# Patient Record
Sex: Male | Born: 1960 | Hispanic: No | Marital: Married | Smoking: Never smoker
Health system: Southern US, Community
[De-identification: ages and names within clinical notes are randomized; demographics above are authoritative.]

## PROBLEM LIST (undated history)

## (undated) DIAGNOSIS — E785 Hyperlipidemia, unspecified: Secondary | ICD-10-CM

## (undated) DIAGNOSIS — Z87442 Personal history of urinary calculi: Secondary | ICD-10-CM

## (undated) HISTORY — DX: Hyperlipidemia, unspecified: E78.5

## (undated) HISTORY — PX: SHOULDER ARTHROSCOPY: SHX128

---

## 2007-01-12 ENCOUNTER — Ambulatory Visit (HOSPITAL_BASED_OUTPATIENT_CLINIC_OR_DEPARTMENT_OTHER): Admission: RE | Admit: 2007-01-12 | Discharge: 2007-01-12 | Payer: Self-pay | Admitting: Orthopedic Surgery

## 2008-02-06 ENCOUNTER — Encounter: Admission: RE | Admit: 2008-02-06 | Discharge: 2008-02-06 | Payer: Self-pay | Admitting: Family Medicine

## 2010-06-15 NOTE — Op Note (Signed)
NAME:  Fernando [name], Fernando [name]                  ACCOUNT NO.:  [id number]   MEDICAL RECORD NO.:  [id number]          PATIENT TYPE:  AMB   LOCATION:  DSC                          FACILITY:  [facility]   PHYSICIAN:  Fernando [name], M.D.   DATE OF BIRTH:  September 07, 1960   DATE OF PROCEDURE:  01/12/2007  DATE OF DISCHARGE:                               OPERATIVE REPORT   PREOPERATIVE DIAGNOSES:  1. Rotator cuff tear, left.  2. Impingement.  3. Acromioclavicular joint arthritis.   POSTOPERATIVE DIAGNOSES:  1. Rotator cuff tear, left.  2. Impingement.  3. Acromioclavicular joint arthritis.   PROCEDURE:  1. Mini open rotator cuff repair of acutely torn rotator cuff.  2. Arthroscopic acromioplasty from lateral and posterior compartment.  3. Arthroscopic distal clavicle resection from an anterior      compartment.   SURGEON:  Fernando [name], M.D.   ASSISTANT:  Marshia Ly, P.A.   ANESTHESIA:  General.   BRIEF HISTORY:  [name] is a 50 year old male with a long history of  having had significant left shoulder problems.  He ultimately was  evaluated in the office and felt to have impingement AC joint arthritis.  An MRI was obtained because of some concerns of a look on the humeral  head, and this showed that he did have a full thickness rotator cuff  tear.  We treated him conservatively for a period of time.  Because of  the failure of all conservative care, he was ultimately taken to the  operating room for arthroscopic subacromial decompression and distal  clavicle resection and fixation of rotator cuff.   PROCEDURE:  Patient was taken to the operating room.  After adequate  anesthesia was obtained with general anesthetic, the patient was placed  on the operating room table.  The left shoulder was then prepped and  draped in the usual sterile fashion.  Following this, routine  arthroscopic examination of the shoulder revealed there was an obvious  rotator cuff tear on the under-surface just  posterior to the biceps  tendon insertion and supraspinatus.  Following this, attention was  turned to the glenohumeral joint.  No evidence of arthritis.  The biceps  tendon was normal.  Attention to the posterior superior labrum was  normal.  Attention was turned out of the glenohumeral joint into the  subacromial space.  At this point, an anterolateral acromioplasty was  performed from the lateral and posterior compartment.  The attention was  then turned towards the distal clavicle, which was resected over 18 mm  from an anterior compartment.  The rotator cuff was then identified from  the top side, having a small full-thickness tear.  Knowing what we knew  from the inside, we knew that it was going to be important to open this  up and extend this small tear so that we could get the full portion of  the cuff that was torn anchored back down to bone.  At this point, the  arthroscopic portion of the case was abandoned.  A small incision was  made laterally to the subcutaneous tissues __________  the deltoid,  divided in line with its fibers.  Attention was then turned to the  underlying rotator cuff.  The underlying rotator cuff was debrided on  the under-surface back to a full viable tendon.  The rotator cuff was  then repaired with a single 5.5 anchor with anterior and posterior  knots.  Excellent repair of the rotator cuff was achieved at this point.  The arm was put through an easy, full range of motion.  At this point,  the shoulder was copiously and thoroughly irrigated and suctioned dry.  The arthroscopic portals were closed with a bandage.  The lateral wound  was closed with one Vicryl running in the deltoid and 0 Vicryl in the  subcu and then 3-0 Monocryl subcuticular stitch.  Benzoin and Steri-  Strips were applied.  A sterile compressive dressing was applied over  the entire shoulder, and the patient was then taken to the recovery room  and was noted to be in satisfactory  condition.  Estimated blood loss for  the procedure was __________ .      Fernando [name], M.D.  Electronically Signed     JLG/MEDQ  D:  01/12/2007  T:  01/12/2007  Job:  [id number]

## 2010-11-08 LAB — POCT HEMOGLOBIN-HEMACUE: Hemoglobin: 16.6

## 2017-01-20 ENCOUNTER — Telehealth: Payer: Self-pay

## 2017-01-20 NOTE — Telephone Encounter (Signed)
.........  CHART PREP RJ...... 

## 2017-03-01 NOTE — Progress Notes (Deleted)
    Cardiology Office Note   Date:  03/01/2017   ID:  Fernando [name], DOB 11/27/1960, MRN [id number]  PCP:  No primary care provider on file.  Cardiologist:   No primary care provider on file. Referring:  ***  No chief complaint on file.     History of Present Illness: Fernando [name] is a 57 y.o. male who is referred by *** for evaluation of chest pain.       No past medical history on file.  *** The histories are not reviewed yet. Please review them in the "History" navigator section and refresh this SmartLink.   No current outpatient medications on file.   No current facility-administered medications for this visit.     Allergies:   Patient has no allergy information on record.    Social History:  The patient     Family History:  The patient's ***family history is not on file.    ROS:  Please see the history of present illness.   Otherwise, review of systems are positive for {NONE DEFAULTED:18576::"none"}.   All other systems are reviewed and negative.    PHYSICAL EXAM: VS:  There were no vitals taken for this visit. , BMI There is no height or weight on file to calculate BMI. GENERAL:  Well appearing HEENT:  Pupils equal round and reactive, fundi not visualized, oral mucosa unremarkable NECK:  No jugular venous distention, waveform within normal limits, carotid upstroke brisk and symmetric, no bruits, no thyromegaly LYMPHATICS:  No cervical, inguinal adenopathy LUNGS:  Clear to auscultation bilaterally BACK:  No CVA tenderness CHEST:  Unremarkable HEART:  PMI not displaced or sustained,S1 and S2 within normal limits, no S3, no S4, no clicks, no rubs, *** murmurs ABD:  Flat, positive bowel sounds normal in frequency in pitch, no bruits, no rebound, no guarding, no midline pulsatile mass, no hepatomegaly, no splenomegaly EXT:  2 plus pulses throughout, no edema, no cyanosis no clubbing SKIN:  No rashes no nodules NEURO:  Cranial nerves II through XII  grossly intact, motor grossly intact throughout PSYCH:  Cognitively intact, oriented to person place and time    EKG:  EKG {ACTION; IS/IS XLK:[id number]}OT:[id number]} ordered today. The ekg ordered today demonstrates ***   Recent Labs: No results found for requested labs within last 8760 hours.    Lipid Panel No results found for: CHOL, TRIG, HDL, CHOLHDL, VLDL, LDLCALC, LDLDIRECT    Wt Readings from Last 3 Encounters:  No data found for Wt      Other studies Reviewed: Additional studies/ records that were reviewed today include: ***. Review of the above records demonstrates:  Please see elsewhere in the note.  ***   ASSESSMENT AND PLAN:  CHEST PAIN:  ***    Current medicines are reviewed at length with the patient today.  The patient {ACTIONS; HAS/DOES NOT HAVE:19233} concerns regarding medicines.  The following changes have been made:  {PLAN; NO CHANGE:13088:s}  Labs/ tests ordered today include: *** No orders of the defined types were placed in this encounter.    Disposition:   FU with ***    Signed, Rollene RotundaJames Nixon Kolton, MD  03/01/2017 9:00 PM    [facility]

## 2017-03-03 ENCOUNTER — Ambulatory Visit: Payer: Self-pay | Admitting: Cardiology

## 2017-06-03 ENCOUNTER — Encounter (HOSPITAL_COMMUNITY): Payer: Self-pay

## 2017-06-03 ENCOUNTER — Other Ambulatory Visit: Payer: Self-pay

## 2017-06-03 ENCOUNTER — Emergency Department (HOSPITAL_COMMUNITY)
Admission: EM | Admit: 2017-06-03 | Discharge: 2017-06-04 | Disposition: A | Discharge status: Home / Self Care | Payer: No Typology Code available for payment source | Attending: Emergency Medicine | Admitting: Emergency Medicine

## 2017-06-03 DIAGNOSIS — R1032 Left lower quadrant pain: Secondary | ICD-10-CM | POA: Diagnosis present

## 2017-06-03 DIAGNOSIS — N201 Calculus of ureter: Secondary | ICD-10-CM | POA: Diagnosis not present

## 2017-06-03 NOTE — ED Triage Notes (Signed)
Pt came in ems, severe abd pain started after dinner. Per pt states gluten allergy. States had bread at dinner. Rates pain 10/10

## 2017-06-03 NOTE — ED Triage Notes (Signed)
[facility] reports with LLQ abd pain with N/V, denies dx of celiac but pt ate a lot of bread.  Recent reduced  Bilateral hernias, pt guarding LLQ. [facility] reports pt requesting something to help him "relax".  Pt received 4 mg Zofran IV en route.

## 2017-06-04 ENCOUNTER — Emergency Department (HOSPITAL_COMMUNITY): Payer: No Typology Code available for payment source

## 2017-06-04 LAB — COMPREHENSIVE METABOLIC PANEL
ALT: 20 U/L (ref 17–63)
ANION GAP: 8 (ref 5–15)
AST: 20 U/L (ref 15–41)
Albumin: 4 g/dL (ref 3.5–5.0)
Alkaline Phosphatase: 49 U/L (ref 38–126)
BUN: 22 mg/dL — ABNORMAL HIGH (ref 6–20)
CO2: 26 mmol/L (ref 22–32)
CREATININE: 1.21 mg/dL (ref 0.61–1.24)
Calcium: 9 mg/dL (ref 8.9–10.3)
Chloride: 109 mmol/L (ref 101–111)
Glucose, Bld: 150 mg/dL — ABNORMAL HIGH (ref 65–99)
Potassium: 3.8 mmol/L (ref 3.5–5.1)
SODIUM: 143 mmol/L (ref 135–145)
Total Bilirubin: 0.8 mg/dL (ref 0.3–1.2)
Total Protein: 6.2 g/dL — ABNORMAL LOW (ref 6.5–8.1)

## 2017-06-04 LAB — CBC
HCT: 40.1 % (ref 39.0–52.0)
Hemoglobin: 13.3 g/dL (ref 13.0–17.0)
MCH: 30 pg (ref 26.0–34.0)
MCHC: 33.2 g/dL (ref 30.0–36.0)
MCV: 90.5 fL (ref 78.0–100.0)
PLATELETS: 234 10*3/uL (ref 150–400)
RBC: 4.43 MIL/uL (ref 4.22–5.81)
RDW: 12.7 % (ref 11.5–15.5)
WBC: 12.2 10*3/uL — AB (ref 4.0–10.5)

## 2017-06-04 LAB — URINALYSIS, ROUTINE W REFLEX MICROSCOPIC
Bilirubin Urine: NEGATIVE
GLUCOSE, UA: NEGATIVE mg/dL
Ketones, ur: 5 mg/dL — AB
Leukocytes, UA: NEGATIVE
Nitrite: NEGATIVE
PROTEIN: NEGATIVE mg/dL
Specific Gravity, Urine: 1.013 (ref 1.005–1.030)
pH: 7 (ref 5.0–8.0)

## 2017-06-04 LAB — LIPASE, BLOOD: LIPASE: 23 U/L (ref 11–51)

## 2017-06-04 MED ORDER — HYDROCODONE-ACETAMINOPHEN 5-325 MG PO TABS: 1.0000 | ORAL_TABLET | Freq: Four times a day (QID) | ORAL | 0 refills | Status: DC | PRN

## 2017-06-04 MED ORDER — HYDROCODONE-ACETAMINOPHEN 5-325 MG PO TABS
2.0000 | ORAL_TABLET | Freq: Once | ORAL | Status: AC
  Administered 2017-06-04: 2 via ORAL
  Filled 2017-06-04: qty 2

## 2017-06-04 MED ORDER — ONDANSETRON HCL 4 MG/2ML IJ SOLN
4.0000 mg | Freq: Once | INTRAMUSCULAR | Status: AC
  Administered 2017-06-04: 4 mg via INTRAVENOUS
  Filled 2017-06-04: qty 2

## 2017-06-04 MED ORDER — TAMSULOSIN HCL 0.4 MG PO CAPS: 0.4000 mg | ORAL_CAPSULE | Freq: Every day | ORAL | 0 refills | Status: DC

## 2017-06-04 MED ORDER — FENTANYL CITRATE (PF) 100 MCG/2ML IJ SOLN
100.0000 ug | Freq: Once | INTRAMUSCULAR | Status: AC
  Administered 2017-06-04: 100 ug via INTRAVENOUS
  Filled 2017-06-04: qty 2

## 2017-06-04 MED ORDER — CEPHALEXIN 500 MG PO CAPS: 500.0000 mg | ORAL_CAPSULE | Freq: Two times a day (BID) | ORAL | 0 refills | Status: DC

## 2017-06-04 MED ORDER — SODIUM CHLORIDE 0.9 % IV BOLUS
1000.0000 mL | Freq: Once | INTRAVENOUS | Status: AC
  Administered 2017-06-04: 1000 mL via INTRAVENOUS

## 2017-06-04 MED ORDER — CEPHALEXIN 500 MG PO CAPS
500.0000 mg | ORAL_CAPSULE | Freq: Once | ORAL | Status: AC
  Administered 2017-06-04: 500 mg via ORAL
  Filled 2017-06-04: qty 1

## 2017-06-04 MED ORDER — IOPAMIDOL (ISOVUE-300) INJECTION 61%
100.0000 mL | Freq: Once | INTRAVENOUS | Status: AC | PRN
  Administered 2017-06-04: 100 mL via INTRAVENOUS

## 2017-06-04 NOTE — ED Provider Notes (Signed)
[facility] EMERGENCY DEPARTMENT Provider Note   CSN: [id number] Arrival date & time: 06/03/17  2210     History   Chief Complaint Chief Complaint  Patient presents with  . Abdominal Pain    HPI Fernando [name] is a 57 y.o. male.  The history is provided by the patient and the spouse.  Abdominal Pain   This is a new problem. The current episode started 3 to 5 hours ago. The problem occurs constantly. The problem has been rapidly worsening. The pain is associated with eating. The pain is located in the LLQ. The pain is severe. Associated symptoms include nausea and vomiting. Pertinent negatives include fever and dysuria. The symptoms are aggravated by certain positions and palpation. Nothing relieves the symptoms.  Patient reports after eating bread tonight he had sudden onset of left lower quadrant abdominal pain.  He reports nausea and vomiting, with nonbloody emesis.  No fever.  No chest pain shortness of breath.  No diarrhea or constipation.  No change in his bowel habits. He was recently seen in urgent care in [facility] and was told he had a hernia. He reports recently when he eats bread he has abdominal pain he thinks he has a gluten allergy   PMH-none surg hx - none Home Medications    Prior to Admission medications   Not on File    Family History No family history on file.  Social History Social History   Tobacco Use  . Smoking status: Not on file  Substance Use Topics  . Alcohol use: Never    Frequency: Never  . Drug use: Never     Allergies   Gluten meal   Review of Systems Review of Systems  Constitutional: Negative for fever.  Respiratory: Negative for shortness of breath.   Cardiovascular: Negative for chest pain.  Gastrointestinal: Positive for abdominal pain, nausea and vomiting.  Genitourinary: Negative for dysuria.  All other systems reviewed and are negative.    Physical Exam Updated Vital Signs BP 136/78 (BP  Location: Right Arm)   Pulse 88   Temp 98.2 F (36.8 C) (Oral)   Resp 20   Ht 1.778 m ( )   Wt 81.6 kg (180 lb)   SpO2 98%   BMI 25.83 kg/m   Physical Exam CONSTITUTIONAL: Well developed/well nourished HEAD: Normocephalic/atraumatic EYES: EOMI/PERRL ENMT: Mucous membranes dry NECK: supple no meningeal signs SPINE/BACK:entire spine nontender CV: S1/S2 noted, no murmurs/rubs/gallops noted LUNGS: Lungs are clear to auscultation bilaterally, no apparent distress ABDOMEN: soft, moderate left lower quadrant tenderness, no rebound or guarding, bowel sounds noted throughout abdomen No abdominal wall hernia noted GU:no cva tenderness, no inguinal hernia.  No testicular tenderness.  Testicles descended bilaterally Wife at bedside per patient request NEURO: Pt is awake/alert/appropriate, moves all extremitiesx4.  No facial droop.   EXTREMITIES: pulses normal/equal, full ROM SKIN: warm, color normal PSYCH: no abnormalities of mood noted, alert and oriented to situation   ED Treatments / Results  Labs (all labs ordered are listed, but only abnormal results are displayed) Labs Reviewed  COMPREHENSIVE METABOLIC PANEL - Abnormal; Notable for the following components:      Result Value   Glucose, Bld 150 (*)    BUN 22 (*)    Total Protein 6.2 (*)    All other components within normal limits  CBC - Abnormal; Notable for the following components:   WBC 12.2 (*)    All other components within normal limits  URINALYSIS, ROUTINE W  REFLEX MICROSCOPIC - Abnormal; Notable for the following components:   Hgb urine dipstick LARGE (*)    Ketones, ur 5 (*)    RBC / HPF >50 (*)    Bacteria, UA RARE (*)    All other components within normal limits  LIPASE, BLOOD    EKG None  Radiology No results found.  Procedures Procedures (including critical care time)  Medications Ordered in ED Medications  HYDROcodone-acetaminophen (NORCO/VICODIN) 5-325 MG per tablet 2 tablet (has no  administration in time range)  cephALEXin (KEFLEX) capsule 500 mg (has no administration in time range)  fentaNYL (SUBLIMAZE) injection 100 mcg (100 mcg Intravenous Given 06/04/17 0045)  ondansetron (ZOFRAN) injection 4 mg (4 mg Intravenous Given 06/04/17 0044)  sodium chloride 0.9 % bolus 1,000 mL (0 mLs Intravenous Stopped 06/04/17 0126)  iopamidol (ISOVUE-300) 61 % injection 100 mL (100 mLs Intravenous Contrast Given 06/04/17 0053)     Initial Impression / Assessment and Plan / ED Course  I have reviewed the triage vital signs and the nursing notes. Narcotic database reviewed and considered in decision making Pertinent labs  results that were available during my care of the patient were reviewed by me and considered in my medical decision making (see chart for details).     Improved.  He has a very large ureteral stone. No Signs of any other acute abdominal emergency.  No signs of any incarcerated hernias. He is improved.  He is not septic appearing.  He is afebrile. Prescribe pain medicines, Flomax, Keflex.  He should call urology in 24 hours.  We discussed strict ER return precautions  Final Clinical Impressions(s) / ED Diagnoses   Final diagnoses:  Ureteral stone    ED Discharge Orders        Ordered    HYDROcodone-acetaminophen (NORCO/VICODIN) 5-325 MG tablet  Every 6 hours PRN     06/04/17 0240    cephALEXin (KEFLEX) 500 MG capsule  2 times daily     06/04/17 0240    tamsulosin (FLOMAX) 0.4 MG CAPS capsule  Daily after supper     06/04/17 0240       Zadie Rhine, MD 06/04/17 [phone]

## 2017-06-05 ENCOUNTER — Other Ambulatory Visit: Payer: Self-pay

## 2017-06-05 ENCOUNTER — Other Ambulatory Visit: Payer: Self-pay | Admitting: Urology

## 2017-06-05 ENCOUNTER — Ambulatory Visit (HOSPITAL_COMMUNITY): Payer: No Typology Code available for payment source | Admitting: Certified Registered"

## 2017-06-05 ENCOUNTER — Encounter (HOSPITAL_COMMUNITY): Payer: Self-pay | Admitting: *Deleted

## 2017-06-05 ENCOUNTER — Emergency Department (HOSPITAL_COMMUNITY)
Admission: EM | Admit: 2017-06-05 | Discharge: 2017-06-05 | Disposition: A | Discharge status: Home / Self Care | Payer: No Typology Code available for payment source | Source: Home / Self Care | Attending: Emergency Medicine | Admitting: Emergency Medicine

## 2017-06-05 ENCOUNTER — Ambulatory Visit (HOSPITAL_COMMUNITY): Payer: No Typology Code available for payment source

## 2017-06-05 ENCOUNTER — Ambulatory Visit (HOSPITAL_COMMUNITY)
Admission: RE | Admit: 2017-06-05 | Discharge: 2017-06-05 | Disposition: A | Discharge status: Home / Self Care | Payer: No Typology Code available for payment source | Source: Other Acute Inpatient Hospital | Attending: Urology | Admitting: Urology

## 2017-06-05 ENCOUNTER — Emergency Department (HOSPITAL_COMMUNITY)
Admission: EM | Admit: 2017-06-05 | Discharge: 2017-06-05 | Discharge status: Absent without leave | Payer: PRIVATE HEALTH INSURANCE | Source: Home / Self Care

## 2017-06-05 ENCOUNTER — Encounter (HOSPITAL_COMMUNITY)
Admission: RE | Disposition: A | Discharge status: Home / Self Care | Payer: Self-pay | Source: Other Acute Inpatient Hospital | Attending: Urology

## 2017-06-05 DIAGNOSIS — N201 Calculus of ureter: Secondary | ICD-10-CM | POA: Insufficient documentation

## 2017-06-05 DIAGNOSIS — N23 Unspecified renal colic: Secondary | ICD-10-CM | POA: Insufficient documentation

## 2017-06-05 DIAGNOSIS — Z79899 Other long term (current) drug therapy: Secondary | ICD-10-CM

## 2017-06-05 HISTORY — PX: CYSTOSCOPY W/ URETERAL STENT PLACEMENT: SHX1429

## 2017-06-05 LAB — CBC WITH DIFFERENTIAL/PLATELET
BASOS PCT: 0 %
Basophils Absolute: 0 10*3/uL (ref 0.0–0.1)
EOS PCT: 2 %
Eosinophils Absolute: 0.2 10*3/uL (ref 0.0–0.7)
HCT: 43.3 % (ref 39.0–52.0)
HEMOGLOBIN: 14.3 g/dL (ref 13.0–17.0)
Lymphocytes Relative: 21 %
Lymphs Abs: 2.1 10*3/uL (ref 0.7–4.0)
MCH: 30.2 pg (ref 26.0–34.0)
MCHC: 33 g/dL (ref 30.0–36.0)
MCV: 91.4 fL (ref 78.0–100.0)
Monocytes Absolute: 0.7 10*3/uL (ref 0.1–1.0)
Monocytes Relative: 7 %
NEUTROS PCT: 70 %
Neutro Abs: 7.2 10*3/uL (ref 1.7–7.7)
PLATELETS: 215 10*3/uL (ref 150–400)
RBC: 4.74 MIL/uL (ref 4.22–5.81)
RDW: 12.9 % (ref 11.5–15.5)
WBC: 10.2 10*3/uL (ref 4.0–10.5)

## 2017-06-05 LAB — BASIC METABOLIC PANEL
Anion gap: 9 (ref 5–15)
BUN: 17 mg/dL (ref 6–20)
CHLORIDE: 104 mmol/L (ref 101–111)
CO2: 26 mmol/L (ref 22–32)
CREATININE: 1.05 mg/dL (ref 0.61–1.24)
Calcium: 8.7 mg/dL — ABNORMAL LOW (ref 8.9–10.3)
Glucose, Bld: 132 mg/dL — ABNORMAL HIGH (ref 65–99)
POTASSIUM: 3.5 mmol/L (ref 3.5–5.1)
SODIUM: 139 mmol/L (ref 135–145)

## 2017-06-05 SURGERY — CYSTOSCOPY, WITH RETROGRADE PYELOGRAM AND URETERAL STENT INSERTION
Anesthesia: General | Site: Ureter | Laterality: Left

## 2017-06-05 MED ORDER — PROPOFOL 10 MG/ML IV BOLUS
INTRAVENOUS | Status: DC | PRN
  Administered 2017-06-05: 200 mg via INTRAVENOUS

## 2017-06-05 MED ORDER — ONDANSETRON HCL 4 MG PO TABS: 4.0000 mg | ORAL_TABLET | Freq: Every day | ORAL | 1 refills | Status: DC | PRN

## 2017-06-05 MED ORDER — ONDANSETRON HCL 4 MG/2ML IJ SOLN
4.0000 mg | Freq: Once | INTRAMUSCULAR | Status: AC
  Administered 2017-06-05: 4 mg via INTRAVENOUS
  Filled 2017-06-05: qty 2

## 2017-06-05 MED ORDER — PHENYLEPHRINE HCL 10 MG/ML IJ SOLN
INTRAMUSCULAR | Status: DC | PRN
  Administered 2017-06-05: 80 ug via INTRAVENOUS

## 2017-06-05 MED ORDER — HYDROMORPHONE HCL 1 MG/ML IJ SOLN
1.0000 mg | Freq: Once | INTRAMUSCULAR | Status: AC
  Administered 2017-06-05: 1 mg via INTRAVENOUS
  Filled 2017-06-05: qty 1

## 2017-06-05 MED ORDER — LACTATED RINGERS IV SOLN
INTRAVENOUS | Status: DC
  Administered 2017-06-05: 16:00:00 via INTRAVENOUS

## 2017-06-05 MED ORDER — IOHEXOL 300 MG/ML  SOLN
INTRAMUSCULAR | Status: DC | PRN
  Administered 2017-06-05: 10 mL

## 2017-06-05 MED ORDER — CEFAZOLIN SODIUM-DEXTROSE 2-4 GM/100ML-% IV SOLN
INTRAVENOUS | Status: AC
  Filled 2017-06-05: qty 100

## 2017-06-05 MED ORDER — OXYCODONE HCL 5 MG PO TABS: 5.0000 mg | ORAL_TABLET | Freq: Once | ORAL | Status: DC | PRN

## 2017-06-05 MED ORDER — ONDANSETRON HCL 4 MG/2ML IJ SOLN
INTRAMUSCULAR | Status: AC
  Administered 2017-06-05: 4 mg via INTRAVENOUS
  Filled 2017-06-05: qty 2

## 2017-06-05 MED ORDER — DEXAMETHASONE SODIUM PHOSPHATE 10 MG/ML IJ SOLN
INTRAMUSCULAR | Status: DC | PRN
  Administered 2017-06-05: 10 mg via INTRAVENOUS

## 2017-06-05 MED ORDER — ONDANSETRON HCL 4 MG/2ML IJ SOLN
INTRAMUSCULAR | Status: AC
  Filled 2017-06-05: qty 2

## 2017-06-05 MED ORDER — SODIUM CHLORIDE 0.9 % IR SOLN
Status: DC | PRN
  Administered 2017-06-05: 1000 mL

## 2017-06-05 MED ORDER — 0.9 % SODIUM CHLORIDE (POUR BTL) OPTIME
TOPICAL | Status: DC | PRN
  Administered 2017-06-05: 1000 mL

## 2017-06-05 MED ORDER — FENTANYL CITRATE (PF) 100 MCG/2ML IJ SOLN: 25.0000 ug | INTRAMUSCULAR | Status: DC | PRN

## 2017-06-05 MED ORDER — ACETAMINOPHEN 325 MG PO TABS: 325.0000 mg | ORAL_TABLET | ORAL | Status: DC | PRN

## 2017-06-05 MED ORDER — FENTANYL CITRATE (PF) 100 MCG/2ML IJ SOLN
INTRAMUSCULAR | Status: AC
  Filled 2017-06-05: qty 2

## 2017-06-05 MED ORDER — EPHEDRINE SULFATE-NACL 50-0.9 MG/10ML-% IV SOSY
PREFILLED_SYRINGE | INTRAVENOUS | Status: DC | PRN
  Administered 2017-06-05: 5 mg via INTRAVENOUS
  Administered 2017-06-05: 10 mg via INTRAVENOUS
  Administered 2017-06-05: 5 mg via INTRAVENOUS

## 2017-06-05 MED ORDER — FENTANYL CITRATE (PF) 100 MCG/2ML IJ SOLN
INTRAMUSCULAR | Status: AC
  Administered 2017-06-05: 100 ug via INTRAVENOUS
  Filled 2017-06-05: qty 2

## 2017-06-05 MED ORDER — FENTANYL CITRATE (PF) 100 MCG/2ML IJ SOLN
INTRAMUSCULAR | Status: DC | PRN
  Administered 2017-06-05: 50 ug via INTRAVENOUS

## 2017-06-05 MED ORDER — DEXAMETHASONE SODIUM PHOSPHATE 10 MG/ML IJ SOLN
INTRAMUSCULAR | Status: AC
  Filled 2017-06-05: qty 1

## 2017-06-05 MED ORDER — ONDANSETRON HCL 4 MG/2ML IJ SOLN
4.0000 mg | Freq: Once | INTRAMUSCULAR | Status: AC
  Administered 2017-06-05: 4 mg via INTRAVENOUS

## 2017-06-05 MED ORDER — OXYCODONE-ACETAMINOPHEN 5-325 MG PO TABS
1.0000 | ORAL_TABLET | Freq: Once | ORAL | Status: AC
  Administered 2017-06-05: 1 via ORAL
  Filled 2017-06-05: qty 1

## 2017-06-05 MED ORDER — LIDOCAINE 2% (20 MG/ML) 5 ML SYRINGE
INTRAMUSCULAR | Status: DC | PRN
  Administered 2017-06-05: 100 mg via INTRAVENOUS

## 2017-06-05 MED ORDER — KETOROLAC TROMETHAMINE 10 MG PO TABS: 10.0000 mg | ORAL_TABLET | Freq: Four times a day (QID) | ORAL | 0 refills | Status: DC | PRN

## 2017-06-05 MED ORDER — ACETAMINOPHEN 160 MG/5ML PO SOLN: 325.0000 mg | ORAL | Status: DC | PRN

## 2017-06-05 MED ORDER — OXYCODONE HCL 5 MG/5ML PO SOLN
5.0000 mg | Freq: Once | ORAL | Status: DC | PRN
  Filled 2017-06-05: qty 5

## 2017-06-05 MED ORDER — LIDOCAINE 2% (20 MG/ML) 5 ML SYRINGE
INTRAMUSCULAR | Status: AC
  Filled 2017-06-05: qty 5

## 2017-06-05 MED ORDER — FENTANYL CITRATE (PF) 100 MCG/2ML IJ SOLN
50.0000 ug | INTRAMUSCULAR | Status: DC
  Administered 2017-06-05: 100 ug via INTRAVENOUS

## 2017-06-05 MED ORDER — ONDANSETRON HCL 4 MG/2ML IJ SOLN: 4.0000 mg | Freq: Once | INTRAMUSCULAR | Status: DC | PRN

## 2017-06-05 MED ORDER — OXYCODONE-ACETAMINOPHEN 5-325 MG PO TABS: 1.0000 | ORAL_TABLET | ORAL | 0 refills | Status: DC | PRN

## 2017-06-05 MED ORDER — PHENAZOPYRIDINE HCL 200 MG PO TABS: 200.0000 mg | ORAL_TABLET | Freq: Three times a day (TID) | ORAL | 0 refills | Status: DC | PRN

## 2017-06-05 MED ORDER — CEFAZOLIN SODIUM-DEXTROSE 2-4 GM/100ML-% IV SOLN
2.0000 g | Freq: Once | INTRAVENOUS | Status: AC
  Administered 2017-06-05: 2 g via INTRAVENOUS

## 2017-06-05 MED ORDER — ONDANSETRON HCL 4 MG/2ML IJ SOLN
INTRAMUSCULAR | Status: DC | PRN
  Administered 2017-06-05: 4 mg via INTRAVENOUS

## 2017-06-05 MED ORDER — MEPERIDINE HCL 50 MG/ML IJ SOLN: 6.2500 mg | INTRAMUSCULAR | Status: DC | PRN

## 2017-06-05 MED ORDER — MIDAZOLAM HCL 2 MG/2ML IJ SOLN
INTRAMUSCULAR | Status: AC
  Filled 2017-06-05: qty 2

## 2017-06-05 MED ORDER — MIDAZOLAM HCL 5 MG/5ML IJ SOLN
INTRAMUSCULAR | Status: DC | PRN
  Administered 2017-06-05: 2 mg via INTRAVENOUS

## 2017-06-05 SURGICAL SUPPLY — 13 items
BAG URO CATCHER STRL LF (MISCELLANEOUS) ×3 IMPLANT
CATH URET 5FR 28IN OPEN ENDED (CATHETERS) ×3 IMPLANT
CLOTH BEACON ORANGE TIMEOUT ST (SAFETY) ×3 IMPLANT
COVER FOOTSWITCH UNIV (MISCELLANEOUS) IMPLANT
COVER SURGICAL LIGHT HANDLE (MISCELLANEOUS) ×3 IMPLANT
GLOVE BIOGEL M STRL SZ7.5 (GLOVE) ×3 IMPLANT
GOWN STRL REUS W/TWL LRG LVL3 (GOWN DISPOSABLE) ×6 IMPLANT
GUIDEWIRE STR DUAL SENSOR (WIRE) ×3 IMPLANT
MANIFOLD NEPTUNE II (INSTRUMENTS) ×3 IMPLANT
PACK CYSTO (CUSTOM PROCEDURE TRAY) ×3 IMPLANT
STENT URET 6FRX26 CONTOUR (STENTS) ×3 IMPLANT
TUBING CONNECTING 10 (TUBING) ×2 IMPLANT
TUBING CONNECTING 10' (TUBING) ×1

## 2017-06-05 NOTE — Op Note (Signed)
Operative Note  Preoperative diagnosis:  1.  9 mm left UPJ stone  Postoperative diagnosis: 1.  9 mm left UPJ stone  Procedure(s): 1.  Cystoscopy with left JJ stent placement 2.  Left retrograde pyelogram with intraoperative interpretation of fluoroscopic imaging  Surgeon: Rhoderick Moody, MD  Assistants: None  Anesthesia: General LMA  Complications: None  EBL: Less than 5 mL  Specimens: 1.  None  Drains/Catheters: 1.  Left 6 French by 26 cm JJ stent without tether  Intraoperative findings:   1. 9 millimeter left proximal ureteral stone seen on fluoroscopy 2.  Solitary left collecting system with a filling defect seen in the proximal aspects of the left ureter consistent with a 9 mm stone seen on his recent CT scan.  There were no other filling defects or abnormality seen within the renal pelvis or its associated calyces.  Indication:  Fernando [name] is a 57 y.o. male with an obstructing 9 mm left UPJ stone associated with renal colic, nausea/vomiting and subjective fevers.  He has been consented for the above procedures, voices understanding and wishes to proceed.  Description of procedure:  After informed consent was obtained, the patient was brought to the operating room and general LMA anesthesia was administered. The patient was then placed in the dorsolithotomy position and prepped and draped in usual sterile fashion. A timeout was performed. A 23 French rigid cystoscope was then inserted into the urethral meatus and advanced into the bladder under direct vision. A complete bladder survey revealed no intravesical pathology.  A 5 French open-ended ureteral catheter was then inserted into the left ureteral orifice and a left retrograde pyelogram was obtained, with the findings listed above.  A Glidewire was then advanced through the lumen of the ureteral catheter and up to the left renal pelvis, under fluoroscopic guidance.  A 6 French by 26 cm left JJ stent was then  placed over the wire and into good position within the left collecting system, confirming placement via fluoroscopy.  The patient's bladder was drained.  The patient tolerated the procedure well and was transferred to the postanesthesia in stable condition.  Plan:  Home today with PO Bactrim for one week.  Will schedule outpatient left ureteroscopy vs ESWL in 7 to 10 days.

## 2017-06-05 NOTE — Transfer of Care (Signed)
Immediate Anesthesia Transfer of Care Note  Patient: Fernando [name]  Procedure(s) Performed: CYSTOSCOPY WITH RETROGRADE PYELOGRAM/URETERAL STENT PLACEMENT (Left Ureter)  Patient Location: PACU  Anesthesia Type:General  Level of Consciousness: sedated  Airway & Oxygen Therapy: Patient Spontanous Breathing and Patient connected to face mask oxygen  Post-op Assessment: Report given to RN and Post -op Vital signs reviewed and stable  Post vital signs: Reviewed and stable  Last Vitals:  Vitals Value Taken Time  BP 128/76 06/05/2017  4:17 PM  Temp    Pulse 85 06/05/2017  4:18 PM  Resp    SpO2 100 % 06/05/2017  4:18 PM  Vitals shown include unvalidated device data.  Last Pain:  Vitals:   06/05/17 1421  TempSrc:   PainSc: 4       Patients Stated Pain Goal: 3 (06/05/17 1421)  Complications: No apparent anesthesia complications

## 2017-06-05 NOTE — Anesthesia Postprocedure Evaluation (Signed)
Anesthesia Post Note  Patient: Fernando [name]  Procedure(s) Performed: CYSTOSCOPY WITH RETROGRADE PYELOGRAM/URETERAL STENT PLACEMENT (Left Ureter)     Patient location during evaluation: PACU Anesthesia Type: General Level of consciousness: awake and alert Pain management: pain level controlled Vital Signs Assessment: post-procedure vital signs reviewed and stable Respiratory status: spontaneous breathing, nonlabored ventilation, respiratory function stable and patient connected to nasal cannula oxygen Cardiovascular status: blood pressure returned to baseline and stable Postop Assessment: no apparent nausea or vomiting Anesthetic complications: no    Last Vitals:  Vitals:   06/05/17 1700 06/05/17 1710  BP: 128/78 (!) 142/86  Pulse: 79 86  Resp: 11 15  Temp:  36.7 C  SpO2: 94% 97%    Last Pain:  Vitals:   06/05/17 1710  TempSrc:   PainSc: 0-No pain                 Cecile Hearing

## 2017-06-05 NOTE — Anesthesia Procedure Notes (Signed)
Date/Time: 06/05/2017 4:12 PM Performed by: Minerva Ends, CRNA Oxygen Delivery Method: Simple face mask Placement Confirmation: positive ETCO2 and breath sounds checked- equal and bilateral Dental Injury: Teeth and Oropharynx as per pre-operative assessment

## 2017-06-05 NOTE — Anesthesia Preprocedure Evaluation (Addendum)
Anesthesia Evaluation  Patient identified by MRN, date of birth, ID band Patient awake    Reviewed: Allergy & Precautions, H&P , NPO status , Patient's Chart, lab work & pertinent test results, reviewed documented beta blocker date and time   Airway Mallampati: I  TM Distance: >3 FB Neck ROM: full    Dental no notable dental hx. (+) Dental Advisory Given, Chipped, Poor Dentition   Pulmonary neg pulmonary ROS,    Pulmonary exam normal breath sounds clear to auscultation       Cardiovascular Exercise Tolerance: Good negative cardio ROS Normal cardiovascular exam Rhythm:regular Rate:Normal     Neuro/Psych negative neurological ROS  negative psych ROS   GI/Hepatic negative GI ROS, Neg liver ROS,   Endo/Other  negative endocrine ROS  Renal/GU negative Renal ROS  negative genitourinary   Musculoskeletal negative musculoskeletal ROS (+)   Abdominal Normal abdominal exam  (+)   Peds  Hematology negative hematology ROS (+)   Anesthesia Other Findings   Reproductive/Obstetrics negative OB ROS                            Anesthesia Physical Anesthesia Plan  ASA: II  Anesthesia Plan: General   Post-op Pain Management:    Induction: Intravenous  PONV Risk Score and Plan: 2 and Ondansetron, Dexamethasone and Treatment may vary due to age or medical condition  Airway Management Planned: LMA and Oral ETT  Additional Equipment:   Intra-op Plan:   Post-operative Plan:   Informed Consent: I have reviewed the patients History and Physical, chart, labs and discussed the procedure including the risks, benefits and alternatives for the proposed anesthesia with the patient or authorized representative who has indicated his/her understanding and acceptance.   Dental Advisory Given  Plan Discussed with: CRNA, Anesthesiologist and Surgeon  Anesthesia Plan Comments: ( )        Anesthesia Quick  Evaluation

## 2017-06-05 NOTE — ED Provider Notes (Signed)
[facility] EMERGENCY DEPARTMENT Provider Note   CSN: [id number] Arrival date & time: 06/05/17  0603     History   Chief Complaint Chief Complaint  Patient presents with  . Flank Pain    HPI Fernando [name] is a 57 y.o. male.  Patient presents to the ER for evaluation of left flank pain.  Patient was seen 1 day ago and diagnosed with a kidney stone.  He reports that around 430 this morning he was awakened by severe, constant pain.  Pain is in the left side of his abdomen and in the left back area.     History reviewed. No pertinent past medical history.  There are no active problems to display for this patient.   History reviewed. No pertinent surgical history.      Home Medications    Prior to Admission medications   Medication Sig Start Date End Date Taking? Authorizing Provider  cephALEXin (KEFLEX) 500 MG capsule Take 1 capsule (500 mg total) by mouth 2 (two) times daily. 06/04/17   Zadie Rhine, MD  HYDROcodone-acetaminophen (NORCO/VICODIN) 5-325 MG tablet Take 1 tablet by mouth every 6 (six) hours as needed for severe pain. 06/04/17   Zadie Rhine, MD  tamsulosin (FLOMAX) 0.4 MG CAPS capsule Take 1 capsule (0.4 mg total) by mouth daily after supper. 06/04/17   Zadie Rhine, MD    Family History History reviewed. No pertinent family history.  Social History Social History   Tobacco Use  . Smoking status: Never Smoker  . Smokeless tobacco: Never Used  Substance Use Topics  . Alcohol use: Never    Frequency: Never  . Drug use: Never     Allergies   Gluten meal   Review of Systems Review of Systems  Genitourinary: Positive for flank pain.  All other systems reviewed and are negative.    Physical Exam Updated Vital Signs BP (!) 121/58 (BP Location: Right Arm)   Pulse (!) 50   Temp 97.9 F (36.6 C) (Oral)   Resp 18   Ht  (1.778 m)   Wt 81.6 kg (180 lb)   SpO2 96%   BMI 25.83 kg/m   Physical Exam  Constitutional: He is  oriented to person, place, and time. He appears well-developed and well-nourished. No distress.  HENT:  Head: Normocephalic and atraumatic.  Right Ear: Hearing normal.  Left Ear: Hearing normal.  Nose: Nose normal.  Mouth/Throat: Oropharynx is clear and moist and mucous membranes are normal.  Eyes: Pupils are equal, round, and reactive to light. Conjunctivae and EOM are normal.  Neck: Normal range of motion. Neck supple.  Cardiovascular: Regular rhythm, S1 normal and S2 normal. Exam reveals no gallop and no friction rub.  No murmur heard. Pulmonary/Chest: Effort normal and breath sounds normal. No respiratory distress. He exhibits no tenderness.  Abdominal: Soft. Normal appearance and bowel sounds are normal. There is no hepatosplenomegaly. There is no tenderness. There is no rebound, no guarding, no tenderness at McBurney's point and negative Murphy's sign. No hernia.  Musculoskeletal: Normal range of motion.  Neurological: He is alert and oriented to person, place, and time. He has normal strength. No cranial nerve deficit or sensory deficit. Coordination normal. GCS eye subscore is 4. GCS verbal subscore is 5. GCS motor subscore is 6.  Skin: Skin is warm, dry and intact. No rash noted. No cyanosis.  Psychiatric: He has a normal mood and affect. His speech is normal and behavior is normal. Thought content normal.  Nursing note  and vitals reviewed.    ED Treatments / Results  Labs (all labs ordered are listed, but only abnormal results are displayed) Labs Reviewed  BASIC METABOLIC PANEL - Abnormal; Notable for the following components:      Result Value   Glucose, Bld 132 (*)    Calcium 8.7 (*)    All other components within normal limits  CBC WITH DIFFERENTIAL/PLATELET  URINALYSIS, ROUTINE W REFLEX MICROSCOPIC    EKG None  Radiology Ct Abdomen Pelvis W Contrast  Result Date: 06/04/2017 CLINICAL DATA:  57 year old male with abdominal pain. EXAM: CT ABDOMEN AND PELVIS WITH  CONTRAST TECHNIQUE: Multidetector CT imaging of the abdomen and pelvis was performed using the standard protocol following bolus administration of intravenous contrast. CONTRAST:  ISOVUE-300 IOPAMIDOL (ISOVUE-300) INJECTION 61% COMPARISON:  Abdominal CT dated 02/06/2008 FINDINGS: Lower chest: Minimal bibasilar atelectatic changes. The visualized lung bases are otherwise clear. No intra-abdominal free air or free fluid. Hepatobiliary: Probable mild fatty infiltration of the liver. The liver is otherwise unremarkable. No intrahepatic biliary ductal dilatation. The gallbladder is unremarkable. Pancreas: Unremarkable. No pancreatic ductal dilatation or surrounding inflammatory changes. Spleen: Normal in size without focal abnormality. Adrenals/Urinary Tract: The adrenal glands are unremarkable. There is a stone in the proximal left ureter/UPJ measuring up to 10 mm in greatest dimension. There is associated mild left hydronephrosis. There is a 4 mm nonobstructing stone in the inferior pole of the left kidney. There is mild haziness and enhancement of the proximal left ureteral urothelium. Correlation with urinalysis recommended to exclude superimposed UTI. There is no hydronephrosis or nephrolithiasis on the right. Subcentimeter right renal hypodense lesion is too small to characterize. The urinary bladder is unremarkable. Stomach/Bowel: There is no bowel obstruction or active inflammation. The appendix is not visualized with certainty. No inflammatory changes identified in the right lower quadrant. Vascular/Lymphatic: No significant vascular findings are present. No enlarged abdominal or pelvic lymph nodes. Reproductive: The prostate and seminal vesicles are grossly unremarkable. Other: Small fat containing umbilical hernia Musculoskeletal: Mild degenerative changes of the spine primarily at L4-L5. No acute osseous pathology. IMPRESSION: 1. Obstructing 10 mm stone in the proximal left ureter/UPJ with mild left  hydronephrosis. Correlation with urinalysis recommended to exclude superimposed UTI. An additional 4 mm non-obstructing stone in the inferior pole of the left kidney is also noted. 2. No bowel obstruction or active inflammation. Electronically Signed   By: Elgie Collard M.D.   On: 06/04/2017 01:24    Procedures Procedures (including critical care time)  Medications Ordered in ED Medications  HYDROmorphone (DILAUDID) injection 1 mg (has no administration in time range)  oxyCODONE-acetaminophen (PERCOCET/ROXICET) 5-325 MG per tablet 1 tablet (has no administration in time range)  HYDROmorphone (DILAUDID) injection 1 mg (1 mg Intravenous Given 06/05/17 0633)  ondansetron (ZOFRAN) injection 4 mg (4 mg Intravenous Given 06/05/17 1610)     Initial Impression / Assessment and Plan / ED Course  I have reviewed the triage vital signs and the nursing notes.  Pertinent labs & imaging results that were available during my care of the patient were reviewed by me and considered in my medical decision making (see chart for details).     Patient presents to the ER with recurrent abdominal and flank pain.  Patient was seen 1 day ago with similar symptoms and diagnosed with kidney stone.  Reviewing records reveals that he had a 10 mm stone at the proximal left ureter/UPJ.  Patient did have good pain relief with his initial treatment.  I  suspect that the stone has been intermittently obstructing.  He was treated with IV analgesia here in the ER and has had improvement.  Patient counseled that he should call urology today.  Final Clinical Impressions(s) / ED Diagnoses   Final diagnoses:  Ureteral colic    ED Discharge Orders    None       Gilda Crease, MD 06/05/17 0710

## 2017-06-05 NOTE — H&P (Signed)
Urology Preoperative H&P   Chief Complaint: Left flank pain  History of Present Illness: Fernando [name] is a 57 y.o. male with worsening, constant radiating left flank pain associated with nausea/vomiting and subjective fevers of 101 F at home.  He had a CT stone study on 06/04/2017 that demonstrated an obstructing 9 mm left UPJ stone with mild hydronephrosis.  He was seen in the office today and was visibly uncomfortable secondary to his left flank pain.  He has no prior history of nephrolithiasis or GU surgeries for that matter.  He is urinating without difficulty denies dysuria or hematuria.  History reviewed. No pertinent past medical history.  Past Surgical History:  Procedure Laterality Date  . SHOULDER ARTHROSCOPY     left shoulder     Allergies:  Allergies  Allergen Reactions  . Gluten Meal Other (See Comments)    abd pain    History reviewed. No pertinent family history.  Social History:  reports that he has never smoked. He has never used smokeless tobacco. He reports that he does not drink alcohol or use drugs.  ROS: A complete review of systems was performed.  All systems are negative except for pertinent findings as noted.  Physical Exam:  Vital signs in last 24 hours: Temp:  [97.9 F (36.6 C)-99.7 F (37.6 C)] 99.7 F (37.6 C) (05/06 1338) Pulse Rate:  [50-67] 67 (05/06 1338) Resp:  [18] 18 (05/06 1338) BP: (121-139)/(58-81) 139/81 (05/06 1338) SpO2:  [96 %-97 %] 97 % (05/06 1338) Weight:  [81.6 kg (180 lb)] 81.6 kg (180 lb) (05/06 1338) Constitutional:  Alert and oriented, laying on the exam table in pain Cardiovascular: Regular rate and rhythm, No JVD Respiratory: Normal respiratory effort, Lungs clear bilaterally GI: Abdomen is soft, nontender, nondistended, no abdominal masses GU: Left CVA tenderness Lymphatic: No lymphadenopathy Neurologic: Grossly intact, no focal deficits Psychiatric: Normal mood and affect  Laboratory Data:  Recent Labs   06/04/17 0030 06/05/17 0635  WBC 12.2* 10.2  HGB 13.3 14.3  HCT 40.1 43.3  PLT 234 215    Recent Labs    06/04/17 0030 06/05/17 0635  NA 143 139  K 3.8 3.5  CL 109 104  GLUCOSE 150* 132*  BUN 22* 17  CALCIUM 9.0 8.7*  CREATININE 1.21 1.05     Results for orders placed or performed during the hospital encounter of 06/05/17 (from the past 24 hour(s))  CBC with Differential/Platelet     Status: None   Collection Time: 06/05/17  6:35 AM  Result Value Ref Range   WBC 10.2 4.0 - 10.5 K/uL   RBC 4.74 4.22 - 5.81 MIL/uL   Hemoglobin 14.3 13.0 - 17.0 g/dL   HCT 16.1 09.6 - 04.5 %   MCV 91.4 78.0 - 100.0 fL   MCH 30.2 26.0 - 34.0 pg   MCHC 33.0 30.0 - 36.0 g/dL   RDW 40.9 81.1 - 91.4 %   Platelets 215 150 - 400 K/uL   Neutrophils Relative % 70 %   Neutro Abs 7.2 1.7 - 7.7 K/uL   Lymphocytes Relative 21 %   Lymphs Abs 2.1 0.7 - 4.0 K/uL   Monocytes Relative 7 %   Monocytes Absolute 0.7 0.1 - 1.0 K/uL   Eosinophils Relative 2 %   Eosinophils Absolute 0.2 0.0 - 0.7 K/uL   Basophils Relative 0 %   Basophils Absolute 0.0 0.0 - 0.1 K/uL  Basic metabolic panel     Status: Abnormal   Collection Time: 06/05/17  6:35 AM  Result Value Ref Range   Sodium 139 135 - 145 mmol/L   Potassium 3.5 3.5 - 5.1 mmol/L   Chloride 104 101 - 111 mmol/L   CO2 26 22 - 32 mmol/L   Glucose, Bld 132 (H) 65 - 99 mg/dL   BUN 17 6 - 20 mg/dL   Creatinine, Ser 4.09 0.61 - 1.24 mg/dL   Calcium 8.7 (L) 8.9 - 10.3 mg/dL   GFR calc non Af Amer >60 >60 mL/min   GFR calc Af Amer >60 >60 mL/min   Anion gap 9 5 - 15   No results found for this or any previous visit (from the past 240 hour(s)).  Renal Function: Recent Labs    06/04/17 0030 06/05/17 0635  CREATININE 1.21 1.05   Estimated Creatinine Clearance: 82.1 mL/min (by C-G formula based on SCr of 1.05 mg/dL).  Radiologic Imaging: Ct Abdomen Pelvis W Contrast  Result Date: 06/04/2017 CLINICAL DATA:  57 year old male with abdominal pain. EXAM:  CT ABDOMEN AND PELVIS WITH CONTRAST TECHNIQUE: Multidetector CT imaging of the abdomen and pelvis was performed using the standard protocol following bolus administration of intravenous contrast. CONTRAST:  ISOVUE-300 IOPAMIDOL (ISOVUE-300) INJECTION 61% COMPARISON:  Abdominal CT dated 02/06/2008 FINDINGS: Lower chest: Minimal bibasilar atelectatic changes. The visualized lung bases are otherwise clear. No intra-abdominal free air or free fluid. Hepatobiliary: Probable mild fatty infiltration of the liver. The liver is otherwise unremarkable. No intrahepatic biliary ductal dilatation. The gallbladder is unremarkable. Pancreas: Unremarkable. No pancreatic ductal dilatation or surrounding inflammatory changes. Spleen: Normal in size without focal abnormality. Adrenals/Urinary Tract: The adrenal glands are unremarkable. There is a stone in the proximal left ureter/UPJ measuring up to 10 mm in greatest dimension. There is associated mild left hydronephrosis. There is a 4 mm nonobstructing stone in the inferior pole of the left kidney. There is mild haziness and enhancement of the proximal left ureteral urothelium. Correlation with urinalysis recommended to exclude superimposed UTI. There is no hydronephrosis or nephrolithiasis on the right. Subcentimeter right renal hypodense lesion is too small to characterize. The urinary bladder is unremarkable. Stomach/Bowel: There is no bowel obstruction or active inflammation. The appendix is not visualized with certainty. No inflammatory changes identified in the right lower quadrant. Vascular/Lymphatic: No significant vascular findings are present. No enlarged abdominal or pelvic lymph nodes. Reproductive: The prostate and seminal vesicles are grossly unremarkable. Other: Small fat containing umbilical hernia Musculoskeletal: Mild degenerative changes of the spine primarily at L4-L5. No acute osseous pathology. IMPRESSION: 1. Obstructing 10 mm stone in the proximal left  ureter/UPJ with mild left hydronephrosis. Correlation with urinalysis recommended to exclude superimposed UTI. An additional 4 mm non-obstructing stone in the inferior pole of the left kidney is also noted. 2. No bowel obstruction or active inflammation. Electronically Signed   By: Elgie Collard M.D.   On: 06/04/2017 01:24    I independently reviewed the above imaging studies.  Assessment and Plan Fernando [name] is a 57 y.o. male with an obstructing 9 mm left UPJ calculus with mild hydronephrosis.  -The risk, benefits and alternatives of cystoscopy with left JJ stent placement was discussed with the patient.  He voices understanding wishes to proceed.    Rhoderick Moody, MD 06/05/2017, 3:20 PM  [facility] Pager: [phone]

## 2017-06-05 NOTE — Discharge Instructions (Signed)
General Anesthesia, Adult, Care After °These instructions provide you with information about caring for yourself after your procedure. Your health care provider may also give you more specific instructions. Your treatment has been planned according to current medical practices, but problems sometimes occur. Call your health care provider if you have any problems or questions after your procedure. °What can I expect after the procedure? °After the procedure, it is common to have: °· Vomiting. °· A sore throat. °· Mental slowness. ° °It is common to feel: °· Nauseous. °· Cold or shivery. °· Sleepy. °· Tired. °· Sore or achy, even in parts of your body where you did not have surgery. ° °Follow these instructions at home: °For at least 24 hours after the procedure: °· Do not: °? Participate in activities where you could fall or become injured. °? Drive. °? Use heavy machinery. °? Drink alcohol. °? Take sleeping pills or medicines that cause drowsiness. °? Make important decisions or sign legal documents. °? Take care of children on your own. °· Rest. °Eating and drinking °· If you vomit, drink water, juice, or soup when you can drink without vomiting. °· Drink enough fluid to keep your urine clear or pale yellow. °· Make sure you have little or no nausea before eating solid foods. °· Follow the diet recommended by your health care provider. °General instructions °· Have a responsible adult stay with you until you are awake and alert. °· Return to your normal activities as told by your health care provider. Ask your health care provider what activities are safe for you. °· Take over-the-counter and prescription medicines only as told by your health care provider. °· If you smoke, do not smoke without supervision. °· Keep all follow-up visits as told by your health care provider. This is important. °Contact a health care provider if: °· You continue to have nausea or vomiting at home, and medicines are not helpful. °· You  cannot drink fluids or start eating again. °· You cannot urinate after 8-12 hours. °· You develop a skin rash. °· You have fever. °· You have increasing redness at the site of your procedure. °Get help right away if: °· You have difficulty breathing. °· You have chest pain. °· You have unexpected bleeding. °· You feel that you are having a life-threatening or urgent problem. °This information is not intended to replace advice given to you by your health care provider. Make sure you discuss any questions you have with your health care provider. °Document Released: 04/25/2000 Document Revised: 06/22/2015 Document Reviewed: 01/01/2015 °Elsevier Interactive Patient Education © 2018 [employer] ° ° ° °Cystoscopy, Care After °Refer to this sheet in the next few weeks. These instructions provide you with information about caring for yourself after your procedure. Your health care provider may also give you more specific instructions. Your treatment has been planned according to current medical practices, but problems sometimes occur. Call your health care provider if you have any problems or questions after your procedure. °What can I expect after the procedure? °After the procedure, it is common to have: °· Mild pain when you urinate. Pain should stop within a few minutes after you urinate. This may last for up to 1 week. °· A small amount of blood in your urine for several days. °· Feeling like you need to urinate but producing only a small amount of urine. ° °Follow these instructions at home: ° °Medicines °· Take over-the-counter and prescription medicines only as told by your health   care provider. °· If you were prescribed an antibiotic medicine, take it as told by your health care provider. Do not stop taking the antibiotic even if you start to feel better. °General instructions °· Return to your normal activities as told by your health care provider. Ask your health care provider what activities are safe for  you. °· Do not drive for 24 hours if you received a sedative. °· Watch for any blood in your urine. If the amount of blood in your urine increases, call your health care provider. °· Follow instructions from your health care provider about eating or drinking restrictions. °· If a tissue sample was removed for testing (biopsy) during your procedure, it is your responsibility to get your test results. Ask your health care provider or the department performing the test when your results will be ready. °· Drink enough fluid to keep your urine clear or pale yellow. °· Keep all follow-up visits as told by your health care provider. This is important. °Contact a health care provider if: °· You have pain that gets worse or does not get better with medicine, especially pain when you urinate. °· You have difficulty urinating. °Get help right away if: °· You have more blood in your urine. °· You have blood clots in your urine. °· You have abdominal pain. °· You have a fever or chills. °· You are unable to urinate. °This information is not intended to replace advice given to you by your health care provider. Make sure you discuss any questions you have with your health care provider. °Document Released: 08/06/2004 Document Revised: 06/25/2015 Document Reviewed: 12/04/2014 °Elsevier Interactive Patient Education © 2018 [employer] ° °

## 2017-06-05 NOTE — ED Triage Notes (Signed)
Pt c/o left flank pain that woke him up at 0430; pt is diaphoretic

## 2017-06-05 NOTE — Anesthesia Procedure Notes (Signed)
Procedure Name: LMA Insertion Date/Time: 06/05/2017 3:42 PM Performed by: Minerva Ends, CRNA Pre-anesthesia Checklist: Patient identified, Emergency Drugs available, Suction available and Patient being monitored Patient Re-evaluated:Patient Re-evaluated prior to induction Oxygen Delivery Method: Circle System Utilized Preoxygenation: Pre-oxygenation with 100% oxygen Induction Type: IV induction Ventilation: Mask ventilation without difficulty LMA: LMA inserted LMA Size: 4.0 Number of attempts: 1 Placement Confirmation: positive ETCO2 Tube secured with: Tape Dental Injury: Teeth and Oropharynx as per pre-operative assessment  Comments: Smooth IV induction Turk-- LMA inserted-- atraumatic-- teeth  many chipped-- unchanged after LMA insertion-- bilat BS turk

## 2017-06-06 ENCOUNTER — Encounter (HOSPITAL_COMMUNITY): Payer: Self-pay | Admitting: Urology

## 2017-06-09 ENCOUNTER — Encounter (HOSPITAL_COMMUNITY): Payer: Self-pay | Admitting: *Deleted

## 2017-06-09 ENCOUNTER — Other Ambulatory Visit: Payer: Self-pay | Admitting: Urology

## 2017-06-12 ENCOUNTER — Encounter (HOSPITAL_COMMUNITY): Payer: Self-pay | Admitting: *Deleted

## 2017-06-12 ENCOUNTER — Other Ambulatory Visit: Payer: Self-pay

## 2017-06-14 NOTE — H&P (Signed)
CC: Left flank pain   HPI: Fernando [name] is a 57 year old male with a 48-hour history of worsening left-sided flank pain associated with nausea/vomiting and chills. He is evaluated at the [facility] on 06/04/2017 and had a CT stone study that revealed an obstructing 1 cm left UPJ stone with moderate hydronephrosis. He presented to the office today with persistent left-sided flank pain and the associated symptoms mentioned above. He has no prior history of nephrolithiasis and has never had any type of GU surgery before. He is urinating without difficulty and denies gross hematuria.     ALLERGIES: No Allergies    MEDICATIONS: No Medications    GU PSH: No GU PSH     NON-GU PSH: No Non-GU PSH          GU PMH: No GU PMH     NON-GU PMH: No Non-GU PMH     FAMILY HISTORY: No Family History     SOCIAL HISTORY: Marital Status: Married Preferred Language: English; Ethnicity: Not Hispanic Or Latino; Race: White Does not use smokeless tobacco. Does not use drugs. Drinks 4+ caffeinated drinks per day. Has not had a blood transfusion.     REVIEW OF SYSTEMS:     GU Review Male:  Patient denies frequent urination, hard to postpone urination, burning/ pain with urination, get up at night to urinate, leakage of urine, stream starts and stops, trouble starting your stream, have to strain to urinate , erection problems, and penile pain.    Gastrointestinal (Upper):  Patient reports nausea and vomiting. Patient denies indigestion/ heartburn.    Gastrointestinal (Lower):  Patient denies diarrhea and constipation.    Constitutional:  Patient reports fever, night sweats, and fatigue. Patient denies weight loss.    Skin:  Patient denies skin rash/ lesion and itching.    Eyes:  Patient denies blurred vision and double vision.    Ears/ Nose/ Throat:  Patient denies sore throat and sinus problems.    Hematologic/Lymphatic:  Patient denies swollen glands and easy bruising.    Cardiovascular:  Patient denies leg swelling and  chest pains.    Respiratory:  Patient denies cough and shortness of breath.    Endocrine:  Patient denies excessive thirst.    Musculoskeletal:  Patient reports back pain. Patient denies joint pain.    Neurological:  Patient denies headaches and dizziness.    Psychologic:  Patient denies depression and anxiety.    VITAL SIGNS:       06/05/2017 09:31 AM     Weight 180 lb / 81.65 kg     Height 67 in / 170.18 cm     BP 120/80 mmHg     Pulse 76 /min     Temperature 97.4 F / 36.3 C     BMI 28.2 kg/m     MULTI-SYSTEM PHYSICAL EXAMINATION:      Constitutional: Very uncomfortable on the exam table     Neck: Neck symmetrical, not swollen. Normal tracheal position.     Respiratory: No labored breathing, no use of accessory muscles. Normal breath sounds.     Cardiovascular: Regular rate and rhythm. No murmur, no gallop. Normal temperature, normal extremity pulses, no swelling, no varicosities.     Lymphatic: No enlargement of neck, axillae, groin.     Skin: No paleness, no jaundice, no cyanosis. No lesion, no ulcer, no rash.     Neurologic / Psychiatric: Oriented to time, oriented to place, oriented to person. No depression, no anxiety, no agitation.  Gastrointestinal: No mass, no tenderness, no rigidity, non obese abdomen.     Eyes: Normal conjunctivae. Normal eyelids.     Ears, Nose, Mouth, and Throat: Left ear no scars, no lesions, no masses. Right ear no scars, no lesions, no masses. Nose no scars, no lesions, no masses. Normal hearing. Normal lips.     Musculoskeletal: Normal gait and station of head and neck.            PAST DATA REVIEWED:   Source Of History:  Patient   PROCEDURES: None   ASSESSMENT:     ICD-10 Details  1 GU:  Ureteral calculus - N20.1    PLAN:   Schedule  Return Visit/Planned Activity: ASAP - Schedule Surgery  Document  Letter(s):  Created for Patient: Clinical Summary   Notes:  -The risks, benefits and alternatives of cystoscopy with left JJ stent  placement was discussed with the patient and his wife. Risks include bleeding, urinary tract infection, stent-related discomfort, ureteral stricture disease, stent migration and the inherent risk of general anesthesia. He voices understanding and wishes to proceed.   After a thorough review of the management options for the patient's condition the patient  elected to proceed with surgical therapy as noted above. We have discussed the potential benefits and risks of the procedure, side effects of the proposed treatment, the likelihood of the patient achieving the goals of the procedure, and any potential problems that might occur during the procedure or recuperation. Informed consent has been obtained.

## 2017-06-15 ENCOUNTER — Encounter (HOSPITAL_COMMUNITY)
Admission: RE | Disposition: A | Discharge status: Home / Self Care | Payer: Self-pay | Source: Ambulatory Visit | Attending: Urology

## 2017-06-15 ENCOUNTER — Ambulatory Visit (HOSPITAL_COMMUNITY): Payer: No Typology Code available for payment source

## 2017-06-15 ENCOUNTER — Ambulatory Visit (HOSPITAL_COMMUNITY)
Admission: RE | Admit: 2017-06-15 | Discharge: 2017-06-15 | Disposition: A | Discharge status: Home / Self Care | Payer: No Typology Code available for payment source | Source: Ambulatory Visit | Attending: Urology | Admitting: Urology

## 2017-06-15 ENCOUNTER — Encounter (HOSPITAL_COMMUNITY): Payer: Self-pay | Admitting: General Practice

## 2017-06-15 DIAGNOSIS — N132 Hydronephrosis with renal and ureteral calculous obstruction: Secondary | ICD-10-CM | POA: Insufficient documentation

## 2017-06-15 DIAGNOSIS — N201 Calculus of ureter: Secondary | ICD-10-CM

## 2017-06-15 HISTORY — DX: Personal history of urinary calculi: Z87.442

## 2017-06-15 HISTORY — PX: EXTRACORPOREAL SHOCK WAVE LITHOTRIPSY: SHX1557

## 2017-06-15 SURGERY — LITHOTRIPSY, ESWL
Anesthesia: LOCAL | Laterality: Left

## 2017-06-15 MED ORDER — DIAZEPAM 5 MG PO TABS
10.0000 mg | ORAL_TABLET | ORAL | Status: AC
  Administered 2017-06-15: 10 mg via ORAL
  Filled 2017-06-15: qty 2

## 2017-06-15 MED ORDER — DIPHENHYDRAMINE HCL 25 MG PO CAPS
25.0000 mg | ORAL_CAPSULE | ORAL | Status: AC
  Administered 2017-06-15: 25 mg via ORAL
  Filled 2017-06-15: qty 1

## 2017-06-15 MED ORDER — SODIUM CHLORIDE 0.9 % IV SOLN
INTRAVENOUS | Status: DC
  Administered 2017-06-15: 07:00:00 via INTRAVENOUS

## 2017-06-15 MED ORDER — CIPROFLOXACIN HCL 500 MG PO TABS
500.0000 mg | ORAL_TABLET | ORAL | Status: AC
  Administered 2017-06-15: 500 mg via ORAL
  Filled 2017-06-15: qty 1

## 2017-06-15 NOTE — Interval H&P Note (Signed)
History and Physical Interval Note:  06/15/2017 6:50 AM  Fernando [name]  has presented today for surgery, with the diagnosis of LEFT URETEROPELVIC JUNCTION STONE  The various methods of treatment have been discussed with the patient and family. After consideration of risks, benefits and other options for treatment, the patient has consented to  Procedure(s): LEFT EXTRACORPOREAL SHOCK WAVE LITHOTRIPSY (ESWL) (Left) as a surgical intervention .  The patient's history has been reviewed, patient examined, no change in status, stable for surgery.  I have reviewed the patient's chart and labs.  Questions were answered to the patient's satisfaction.     Dean Goldner A

## 2017-06-15 NOTE — Discharge Instructions (Signed)
I have reviewed discharge instructions in detail with the patient. They will follow-up with me or their physician as scheduled. My nurse will also be calling the patients as per protocol.   

## 2017-06-16 ENCOUNTER — Encounter (HOSPITAL_COMMUNITY): Payer: Self-pay | Admitting: Urology

## 2017-11-13 ENCOUNTER — Ambulatory Visit (INDEPENDENT_AMBULATORY_CARE_PROVIDER_SITE_OTHER): Payer: No Typology Code available for payment source | Admitting: Family Medicine

## 2017-11-13 ENCOUNTER — Encounter: Payer: Self-pay | Admitting: Family Medicine

## 2017-11-13 VITALS — BP 105/67 | HR 88 | Temp 98.0°F | Ht 70.0 in | Wt 186.0 lb

## 2017-11-13 DIAGNOSIS — Z7689 Persons encountering health services in other specified circumstances: Secondary | ICD-10-CM | POA: Diagnosis not present

## 2017-11-13 DIAGNOSIS — K4020 Bilateral inguinal hernia, without obstruction or gangrene, not specified as recurrent: Secondary | ICD-10-CM | POA: Diagnosis not present

## 2017-11-13 NOTE — Progress Notes (Signed)
Subjective:  Patient ID: Fernando [name], male    DOB: 05-17-1960  Age: 57 y.o. MRN: [id number]  CC: Establish Care   HPI [name] presents for new patient examination.  He is concerned about a left inguinal hernia.  He had a recent bout with kidney stones.  At first hernia in the left inguinal region was noted and that was thought to be the source of his pain.  However as it worsened he went through a CT scan that showed that he had 4 kidney stones 1 of which in the ureter was the size of a AAA battery.  He went through lithotripsy for this.  Now he is looking at going to a clinic in [place] to have his surgery performed for the hernia.  He is concerned there might be one on the right as well. Depression screen PHQ 2/9 11/13/2017  Decreased Interest 0  Down, Depressed, Hopeless 0  PHQ - 2 Score 0    History [name] has a past medical history of History of kidney stones.   He has a past surgical history that includes Shoulder arthroscopy; Cystoscopy w/ ureteral stent placement (Left, 06/05/2017); and Extracorporeal shock wave lithotripsy (Left, 06/15/2017).   His family history includes Alcohol abuse in his father; Cancer in his mother; Heart disease in his paternal grandfather.He reports that he has never smoked. He has never used smokeless tobacco. He reports that he does not drink alcohol or use drugs.    ROS Review of Systems  Constitutional: Negative.   HENT: Negative.   Eyes: Negative for visual disturbance.  Respiratory: Negative for cough and shortness of breath.   Cardiovascular: Negative for chest pain and leg swelling.  Gastrointestinal: Negative for abdominal pain, diarrhea, nausea and vomiting.  Genitourinary: Negative for difficulty urinating.  Musculoskeletal: Negative for arthralgias and myalgias.  Skin: Negative for rash.  Neurological: Negative for headaches.  Psychiatric/Behavioral: Negative for sleep disturbance.    Objective:  BP 105/67 (BP  Location: Right Arm)   Pulse 88   Temp 98 F (36.7 C) (Oral)   Ht 5\' 10"  (1.778 m)   Wt 186 lb (84.4 kg)   BMI 26.69 kg/m   BP Readings from Last 3 Encounters:  11/13/17 105/67  06/15/17 (!) 130/95  06/05/17 (!) 142/86    Wt Readings from Last 3 Encounters:  11/13/17 186 lb (84.4 kg)  06/15/17 175 lb (79.4 kg)  06/05/17 180 lb (81.6 kg)     Physical Exam  Constitutional: He is oriented to person, place, and time. He appears well-developed and well-nourished.  HENT:  Head: Normocephalic and atraumatic.  Mouth/Throat: Oropharynx is clear and moist.  Eyes: Pupils are equal, round, and reactive to light. EOM are normal.  Neck: Normal range of motion. No tracheal deviation present. No thyromegaly present.  Cardiovascular: Normal rate, regular rhythm and normal heart sounds. Exam reveals no gallop and no friction rub.  No murmur heard. Pulmonary/Chest: Breath sounds normal. He has no wheezes. He has no rales.  Abdominal: Soft. Bowel sounds are normal. He exhibits no distension and no mass. There is no tenderness. A hernia is present. Hernia confirmed positive in the left inguinal area. Hernia confirmed negative in the right inguinal area (There is an open inguinal ring with no palpable hernia noted.).  Genitourinary: Testes normal and penis normal. Cremasteric reflex is present. Circumcised.  Musculoskeletal: Normal range of motion. He exhibits no edema.  Lymphadenopathy:    He has no cervical adenopathy.  Neurological: He is  alert and oriented to person, place, and time.  Skin: Skin is warm and dry.  Psychiatric: He has a normal mood and affect.      Assessment & Plan:   Samson was seen today for establish care.  Diagnoses and all orders for this visit:  Encounter to establish care  Bilateral inguinal hernia without obstruction or gangrene, recurrence not specified -     US Pelvis Complete; Future       I have discontinued Dejay Kronk. Brick's  HYDROcodone-acetaminophen, tamsulosin, ondansetron, phenazopyridine, and ketorolac.  Allergies as of 11/13/2017   No Known Allergies     Medication List    as of 11/13/2017  6:14 PM   You have not been prescribed any medications.    Patient has been noted to have a high cholesterol in the past.  He is been working on a weight loss program.  Several months ago he went with a vegan diet.  Since that he is lost about 20 pounds.  He is trying to lose a few more and will have his cholesterol tested along with his other blood work when that is done.  Follow-up: Return in about 1 year (around 11/14/2018).  [name], M.D.   Addendum:  Letter regarding hernia repair.   To whom it may concern,   Mr. Srihan Brutus has been seen by me for inguinal hernia.  It is apparent that he needs to have that repaired.  It is his desire to have that done in your clinic.  He is in otherwise excellent health and should be an excellent candidate for that procedure.  Best regards,  [name] MD

## 2017-11-13 NOTE — Progress Notes (Signed)
US

## 2017-11-21 ENCOUNTER — Ambulatory Visit
Admission: RE | Admit: 2017-11-21 | Discharge: 2017-11-21 | Disposition: A | Discharge status: Home / Self Care | Payer: No Typology Code available for payment source | Source: Ambulatory Visit | Attending: Family Medicine | Admitting: Family Medicine

## 2017-11-21 DIAGNOSIS — K4020 Bilateral inguinal hernia, without obstruction or gangrene, not specified as recurrent: Secondary | ICD-10-CM

## 2017-11-22 ENCOUNTER — Other Ambulatory Visit: Payer: Self-pay | Admitting: Family Medicine

## 2017-11-22 DIAGNOSIS — K4090 Unilateral inguinal hernia, without obstruction or gangrene, not specified as recurrent: Secondary | ICD-10-CM

## 2017-12-01 HISTORY — PX: INGUINAL HERNIA REPAIR: SHX194

## 2017-12-12 ENCOUNTER — Telehealth: Payer: Self-pay | Admitting: Family Medicine

## 2017-12-12 NOTE — Telephone Encounter (Signed)
My office note from his most recent visit should do the trick. WS

## 2017-12-13 NOTE — Telephone Encounter (Signed)
Left patient a message.  We will print office notes from visit with provider and give to you with the future lab work results. Call for any questions or concerns.

## 2017-12-14 ENCOUNTER — Other Ambulatory Visit: Payer: Self-pay | Admitting: *Deleted

## 2017-12-14 ENCOUNTER — Other Ambulatory Visit: Payer: No Typology Code available for payment source

## 2017-12-14 ENCOUNTER — Encounter: Payer: Self-pay | Admitting: Family

## 2017-12-14 ENCOUNTER — Ambulatory Visit (INDEPENDENT_AMBULATORY_CARE_PROVIDER_SITE_OTHER): Payer: No Typology Code available for payment source | Admitting: Family

## 2017-12-14 VITALS — BP 130/87 | HR 72 | Temp 97.5°F | Ht 70.0 in | Wt 186.0 lb

## 2017-12-14 DIAGNOSIS — K4020 Bilateral inguinal hernia, without obstruction or gangrene, not specified as recurrent: Secondary | ICD-10-CM | POA: Diagnosis not present

## 2017-12-14 DIAGNOSIS — Z01818 Encounter for other preprocedural examination: Secondary | ICD-10-CM | POA: Diagnosis not present

## 2017-12-14 NOTE — Patient Instructions (Signed)

## 2017-12-14 NOTE — Progress Notes (Signed)
Subjective:    Patient ID: Fernando [name], male    DOB: 04-Nov-1960, 57 y.o.   MRN: [id number]  Chief Complaint  Patient presents with  . surgical clearance    HPI Pt presents to the office today for surgical clearance. He has a inguinal hernia repair scheduled on 12/26/17 in [place] at [facility]. He states they do not use mesh during the procedure and is choosing to have in in [place].  States he wears a hernia belt that helps with his pain. States he has intermittent aching pain of 3 out 10 pain that is worse after heavy exercise or lifting items.   He did lab work, EKG, and a written confirmation of hernia's existence.    He currently not taking any medications and denies any cardiovascular history. He has never seen a Cardiologists. Denies any chest pain, SOB, or  edema.    Review of Systems  All other systems reviewed and are negative.   Family History  Problem Relation Age of Onset  . Cancer Mother        lung  . Alcohol abuse Father   . Heart disease Paternal Grandfather     Social History   Socioeconomic History  . Marital status: Married    Spouse name: Claiborne Billings  . Number of children: Not on file  . Years of education: Not on file  . Highest education level: Not on file  Occupational History  . Occupation: landscape    Comment: self employeed  Social Needs  . Financial resource strain: Not on file  . Food insecurity:    Worry: Not on file    Inability: Not on file  . Transportation needs:    Medical: Not on file    Non-medical: Not on file  Tobacco Use  . Smoking status: Never Smoker  . Smokeless tobacco: Never Used  Substance and Sexual Activity  . Alcohol use: Never    Frequency: Never  . Drug use: Never  . Sexual activity: Never  Lifestyle  . Physical activity:    Days per week: Not on file    Minutes per session: Not on file  . Stress: Not on file  Relationships  . Social connections:    Talks on phone: Not on file    Gets  together: Not on file    Attends religious service: Not on file    Active member of club or organization: Not on file    Attends meetings of clubs or organizations: Not on file    Relationship status: Not on file  Other Topics Concern  . Not on file  Social History Narrative  . Not on file       Objective:   Physical Exam  Constitutional: He is oriented to person, place, and time. He appears well-developed and well-nourished. No distress.  HENT:  Head: Normocephalic.  Right Ear: External ear normal.  Left Ear: External ear normal.  Mouth/Throat: Oropharynx is clear and moist.  Eyes: Pupils are equal, round, and reactive to light. Right eye exhibits no discharge. Left eye exhibits no discharge.  Neck: Normal range of motion. Neck supple. No thyromegaly present.  Cardiovascular: Normal rate, regular rhythm, normal heart sounds and intact distal pulses.  No murmur heard. Pulmonary/Chest: Effort normal and breath sounds normal. No respiratory distress. He has no wheezes.  Abdominal: Soft. Bowel sounds are normal. He exhibits no distension. There is no tenderness. A hernia is present. Hernia confirmed positive in the left inguinal area.  Genitourinary:     Musculoskeletal: Normal range of motion. He exhibits no edema or tenderness.  Lymphadenopathy: No inguinal adenopathy noted on the left side.  Neurological: He is alert and oriented to person, place, and time. He has normal reflexes. No cranial nerve deficit.  Skin: Skin is warm and dry. No rash noted. No erythema.  Psychiatric: He has a normal mood and affect. His behavior is normal. Judgment and thought content normal.  Vitals reviewed.     BP 130/87   Pulse 72   Temp (!) 97.5 F (36.4 C)   Ht 5' 10" (1.778 m)   Wt 186 lb (84.4 kg)   BMI 26.69 kg/m      Assessment & Plan:  Fernando [name] comes in today with chief complaint of surgical clearance   Diagnosis and orders addressed:  1. Bilateral inguinal hernia  without obstruction or gangrene, recurrence not specified - CBC with Differential/Platelet - BMP8+EGFR  2. Pre-op exam - EKG 12-Lead   Labs pending EKG stable We fax office notes, labs, and EKG to [place] and exercise encouraged RTO as needed     Evelina Dun, FNP

## 2017-12-15 ENCOUNTER — Other Ambulatory Visit: Payer: Self-pay | Admitting: Family

## 2017-12-15 LAB — CBC WITH DIFFERENTIAL/PLATELET
BASOS ABS: 0 10*3/uL (ref 0.0–0.2)
Basos: 0 %
EOS (ABSOLUTE): 0.1 10*3/uL (ref 0.0–0.4)
EOS: 1 %
Hematocrit: 41.2 % (ref 37.5–51.0)
Hemoglobin: 13.9 g/dL (ref 13.0–17.7)
Immature Grans (Abs): 0 10*3/uL (ref 0.0–0.1)
Immature Granulocytes: 0 %
LYMPHS ABS: 1.4 10*3/uL (ref 0.7–3.1)
LYMPHS: 19 %
MCH: 30.6 pg (ref 26.6–33.0)
MCHC: 33.7 g/dL (ref 31.5–35.7)
MCV: 91 fL (ref 79–97)
MONOCYTES: 9 %
Monocytes Absolute: 0.7 10*3/uL (ref 0.1–0.9)
NEUTROS ABS: 5.2 10*3/uL (ref 1.4–7.0)
Neutrophils: 71 %
Platelets: 246 10*3/uL (ref 150–450)
RBC: 4.54 x10E6/uL (ref 4.14–5.80)
RDW: 12.5 % (ref 12.3–15.4)
WBC: 7.3 10*3/uL (ref 3.4–10.8)

## 2017-12-15 LAB — BMP8+EGFR
BUN / CREAT RATIO: 13 (ref 9–20)
BUN: 12 mg/dL (ref 6–24)
CHLORIDE: 104 mmol/L (ref 96–106)
CO2: 23 mmol/L (ref 20–29)
Calcium: 9.1 mg/dL (ref 8.7–10.2)
Creatinine, Ser: 0.95 mg/dL (ref 0.76–1.27)
GFR calc non Af Amer: 89 mL/min/{1.73_m2} (ref >59)
GFR, EST AFRICAN AMERICAN: 103 mL/min/{1.73_m2} (ref >59)
Glucose: 96 mg/dL (ref 65–99)
POTASSIUM: 4.1 mmol/L (ref 3.5–5.2)
Sodium: 141 mmol/L (ref 134–144)

## 2018-03-05 ENCOUNTER — Ambulatory Visit (INDEPENDENT_AMBULATORY_CARE_PROVIDER_SITE_OTHER): Payer: No Typology Code available for payment source | Admitting: Family Medicine

## 2018-03-05 ENCOUNTER — Encounter: Payer: Self-pay | Admitting: Family Medicine

## 2018-03-05 VITALS — BP 124/73 | HR 65 | Temp 97.6°F | Ht 70.0 in | Wt 185.2 lb

## 2018-03-05 DIAGNOSIS — Z0000 Encounter for general adult medical examination without abnormal findings: Secondary | ICD-10-CM

## 2018-03-05 DIAGNOSIS — L910 Hypertrophic scar: Secondary | ICD-10-CM

## 2018-03-05 NOTE — Progress Notes (Signed)
Subjective:  Patient ID: Fernando [name], male    DOB: Feb 15, 1960  Age: 58 y.o. MRN: [id number]  CC: Annual Exam   HPI STORM SOVINE presents for Annual exam. Had left inguinal herniorrhaphy in late November.  Hx Mohs to chest for Basal Ca. Hypertrophic scar remains at central chest.  Depression screen [facility] 2/9 03/05/2018 11/13/2017  Decreased Interest 0 0  Down, Depressed, Hopeless 0 0  PHQ - 2 Score 0 0    History Fairley has a past medical history of History of kidney stones.   He has a past surgical history that includes Shoulder arthroscopy; Cystoscopy w/ ureteral stent placement (Left, 06/05/2017); Extracorporeal shock wave lithotripsy (Left, 06/15/2017); and Inguinal hernia repair (Left, 12/2017).   His family history includes Alcohol abuse in his father; Cancer in his mother; Heart disease in his paternal grandfather.He reports that he has never smoked. He has never used smokeless tobacco. He reports that he does not drink alcohol or use drugs.    ROS Review of Systems  Constitutional: Negative for activity change, fatigue and unexpected weight change.  HENT: Negative for congestion, ear pain, hearing loss, postnasal drip and trouble swallowing.   Eyes: Negative for pain and visual disturbance.  Respiratory: Negative for cough, chest tightness and shortness of breath.   Cardiovascular: Negative for chest pain, palpitations and leg swelling.  Gastrointestinal: Negative for abdominal distention, abdominal pain, blood in stool, constipation, diarrhea, nausea and vomiting.  Endocrine: Negative for cold intolerance, heat intolerance and polydipsia.  Genitourinary: Negative for difficulty urinating, dysuria, flank pain, frequency and urgency.  Musculoskeletal: Negative for arthralgias and joint swelling.  Skin: Negative for color change, rash and wound.  Neurological: Negative for dizziness, syncope, speech difficulty, weakness, light-headedness, numbness and headaches.    Hematological: Does not bruise/bleed easily.  Psychiatric/Behavioral: Negative for confusion, decreased concentration, dysphoric mood and sleep disturbance. The patient is not nervous/anxious.     Objective:  BP 124/73   Pulse 65   Temp 97.6 F (36.4 C) (Oral)   Ht '5\' 10"'  (1.778 m)   Wt 185 lb 4 oz (84 kg)   BMI 26.58 kg/m   BP Readings from Last 3 Encounters:  03/05/18 124/73  12/14/17 130/87  11/13/17 105/67    Wt Readings from Last 3 Encounters:  03/05/18 185 lb 4 oz (84 kg)  12/14/17 186 lb (84.4 kg)  11/13/17 186 lb (84.4 kg)     Physical Exam Constitutional:      Appearance: He is well-developed.  HENT:     Head: Normocephalic and atraumatic.  Eyes:     Pupils: Pupils are equal, round, and reactive to light.  Neck:     Musculoskeletal: Normal range of motion.     Thyroid: No thyromegaly.     Trachea: No tracheal deviation.  Cardiovascular:     Rate and Rhythm: Normal rate and regular rhythm.     Heart sounds: Normal heart sounds. No murmur. No friction rub. No gallop.   Pulmonary:     Breath sounds: Normal breath sounds. No wheezing or rales.  Abdominal:     General: Bowel sounds are normal. There is no distension.     Palpations: Abdomen is soft. There is no mass.     Tenderness: There is no abdominal tenderness.     Hernia: There is no hernia in the right inguinal area or left inguinal area.  Genitourinary:    Penis: Normal.      Scrotum/Testes: Normal.  Musculoskeletal: Normal range of  motion.  Lymphadenopathy:     Cervical: No cervical adenopathy.  Skin:    General: Skin is warm and dry.     Findings: Lesion (Hypertrophic scar at sternal chest and justto left 3 inches, horizontally oriented) present.  Neurological:     Mental Status: He is alert and oriented to person, place, and time.       Assessment & Plan:   Cowan was seen today for annual exam.  Diagnoses and all orders for this visit:  Well adult exam -     CBC with  Differential/Platelet -     CMP14+EGFR -     Lipid panel -     PSA, total and free -     Urinalysis  Scar, hypertrophic       Judeth Cornfield. Grabe does not currently have medications on file.  Allergies as of 03/05/2018   No Known Allergies     Medication List    as of March 05, 2018 10:02 AM   You have not been prescribed any medications.      Follow-up: Return in about 1 year (around 03/06/2019).  Claretta Fraise, M.D.

## 2018-03-05 NOTE — Patient Instructions (Signed)
Debrox drops, which you can get at your local drug store and use in right ear for wax.

## 2018-03-06 LAB — CBC WITH DIFFERENTIAL/PLATELET
BASOS ABS: 0 10*3/uL (ref 0.0–0.2)
Basos: 1 %
EOS (ABSOLUTE): 0.1 10*3/uL (ref 0.0–0.4)
Eos: 1 %
HEMATOCRIT: 42.8 % (ref 37.5–51.0)
Hemoglobin: 14.1 g/dL (ref 13.0–17.7)
Immature Grans (Abs): 0 10*3/uL (ref 0.0–0.1)
Immature Granulocytes: 0 %
Lymphocytes Absolute: 1.5 10*3/uL (ref 0.7–3.1)
Lymphs: 29 %
MCH: 30.5 pg (ref 26.6–33.0)
MCHC: 32.9 g/dL (ref 31.5–35.7)
MCV: 93 fL (ref 79–97)
MONOS ABS: 0.4 10*3/uL (ref 0.1–0.9)
Monocytes: 9 %
Neutrophils Absolute: 3.1 10*3/uL (ref 1.4–7.0)
Neutrophils: 60 %
PLATELETS: 234 10*3/uL (ref 150–450)
RBC: 4.62 x10E6/uL (ref 4.14–5.80)
RDW: 12.1 % (ref 11.6–15.4)
WBC: 5.1 10*3/uL (ref 3.4–10.8)

## 2018-03-06 LAB — CMP14+EGFR
ALK PHOS: 55 IU/L (ref 39–117)
ALT: 25 IU/L (ref 0–44)
AST: 20 IU/L (ref 0–40)
Albumin/Globulin Ratio: 2.4 — ABNORMAL HIGH (ref 1.2–2.2)
Albumin: 4.3 g/dL (ref 3.8–4.9)
BUN/Creatinine Ratio: 17 (ref 9–20)
BUN: 15 mg/dL (ref 6–24)
Bilirubin Total: <0.2 mg/dL (ref 0.0–1.2)
CALCIUM: 8.9 mg/dL (ref 8.7–10.2)
CO2: 23 mmol/L (ref 20–29)
CREATININE: 0.89 mg/dL (ref 0.76–1.27)
Chloride: 107 mmol/L — ABNORMAL HIGH (ref 96–106)
GFR calc Af Amer: 110 mL/min/{1.73_m2} (ref >59)
GFR, EST NON AFRICAN AMERICAN: 96 mL/min/{1.73_m2} (ref >59)
GLOBULIN, TOTAL: 1.8 g/dL (ref 1.5–4.5)
Glucose: 113 mg/dL — ABNORMAL HIGH (ref 65–99)
POTASSIUM: 4.6 mmol/L (ref 3.5–5.2)
SODIUM: 145 mmol/L — AB (ref 134–144)
Total Protein: 6.1 g/dL (ref 6.0–8.5)

## 2018-03-06 LAB — LIPID PANEL
Chol/HDL Ratio: 3.6 ratio (ref 0.0–5.0)
Cholesterol, Total: 164 mg/dL (ref 100–199)
HDL: 46 mg/dL (ref >39)
LDL CALC: 102 mg/dL — AB (ref 0–99)
TRIGLYCERIDES: 82 mg/dL (ref 0–149)
VLDL Cholesterol Cal: 16 mg/dL (ref 5–40)

## 2018-03-06 LAB — PSA, TOTAL AND FREE
PSA FREE PCT: 17.7 %
PSA FREE: 0.23 ng/mL
Prostate Specific Ag, Serum: 1.3 ng/mL (ref 0.0–4.0)

## 2018-03-06 NOTE — Progress Notes (Signed)
Hello Coady,  Your lab result is normal.Some minor variations that are not significant are commonly marked abnormal, but do not represent any medical problem for you.  Best regards, Lauri Purdum, M.D.

## 2018-03-19 ENCOUNTER — Encounter: Payer: Self-pay | Admitting: Family Medicine

## 2018-03-19 ENCOUNTER — Ambulatory Visit (INDEPENDENT_AMBULATORY_CARE_PROVIDER_SITE_OTHER): Payer: No Typology Code available for payment source | Admitting: Family Medicine

## 2018-03-19 VITALS — BP 122/68 | HR 94 | Temp 97.2°F | Ht 70.0 in | Wt 181.0 lb

## 2018-03-19 DIAGNOSIS — B372 Candidiasis of skin and nail: Secondary | ICD-10-CM

## 2018-03-19 MED ORDER — FLUCONAZOLE 150 MG PO TABS: 150.0000 mg | ORAL_TABLET | ORAL | 0 refills | Status: DC

## 2018-03-19 NOTE — Progress Notes (Signed)
BP 122/68   Pulse 94   Temp (!) 97.2 F (36.2 C) (Oral)   Ht 5\' 10"  (1.778 m)   Wt 181 lb (82.1 kg)   BMI 25.97 kg/m    Subjective:    Patient ID: Fernando [name], male    DOB: 1960/08/28, 58 y.o.   MRN: [id number]  HPI: Fernando [name] is a 58 y.o. male presenting on 03/19/2018 for Rash (bilateral thigh and under arms x 6 days )   HPI Rash Patient is coming in today with complaints of rash that started on his left inner thigh about 6 days ago and then came to his right inner thigh and just yesterday went to both axilla.  He says the rash is very pruritic and very irritated but not painful.  He denies any drainage from the rash.  He denies any fevers or chills redness or warmth.  He was concerned that it possibly could have been a new soap that he has been using a goat's milk soap bar.  He has tried some Lotrimin and it does help with the itch but has not helped with the rash to start receiving and he has been using it multiple times a day.  He has had jock itch before but this is much worse than anything he is ever had.  He did just take a trip to [place] recently and it started after that trip so he is thinking it may be related to that.  He does have a trip to [place] planned in a week and wants to get better before then.  Relevant past medical, surgical, family and social history reviewed and updated as indicated. Interim medical history since our last visit reviewed. Allergies and medications reviewed and updated.  Review of Systems  Constitutional: Negative for chills and fever.  Respiratory: Negative for shortness of breath and wheezing.   Cardiovascular: Negative for chest pain and leg swelling.  Musculoskeletal: Negative for back pain and gait problem.  Skin: Positive for rash. Negative for color change.  All other systems reviewed and are negative.   Per HPI unless specifically indicated above     Objective:    BP 122/68   Pulse 94   Temp (!) 97.2 F (36.2 C)  (Oral)   Ht 5\' 10"  (1.778 m)   Wt 181 lb (82.1 kg)   BMI 25.97 kg/m   Wt Readings from Last 3 Encounters:  03/19/18 181 lb (82.1 kg)  03/05/18 185 lb 4 oz (84 kg)  12/14/17 186 lb (84.4 kg)    Physical Exam Vitals signs and nursing note reviewed.  Constitutional:      General: He is not in acute distress.    Appearance: He is well-developed. He is not diaphoretic.  Eyes:     General: No scleral icterus.    Conjunctiva/sclera: Conjunctivae normal.  Neck:     Thyroid: No thyromegaly.  Skin:    Findings: Rash (Beefy pink rash with satellite lesions worse on left inner thigh, some on right inner thigh and a small amount under both right axilla, consistent with yeast dermatitis) present.  Neurological:     Mental Status: He is alert and oriented to person, place, and time.     Coordination: Coordination normal.  Psychiatric:        Behavior: Behavior normal.         Assessment & Plan:   Problem List Items Addressed This Visit    None    Visit Diagnoses  Yeast dermatitis    -  Primary   Relevant Medications   fluconazole (DIFLUCAN) 150 MG tablet       Follow up plan: Return if symptoms worsen or fail to improve.  Counseling provided for all of the vaccine components No orders of the defined types were placed in this encounter.   Arville Care, MD [facility] 03/19/2018, 11:53 AM

## 2018-03-22 ENCOUNTER — Encounter: Payer: Self-pay | Admitting: Family Medicine

## 2018-03-22 ENCOUNTER — Ambulatory Visit (INDEPENDENT_AMBULATORY_CARE_PROVIDER_SITE_OTHER): Payer: No Typology Code available for payment source | Admitting: Family Medicine

## 2018-03-22 ENCOUNTER — Telehealth: Payer: Self-pay | Admitting: Family Medicine

## 2018-03-22 VITALS — BP 107/62 | HR 63 | Temp 97.3°F | Ht 70.0 in | Wt 179.2 lb

## 2018-03-22 DIAGNOSIS — R21 Rash and other nonspecific skin eruption: Secondary | ICD-10-CM

## 2018-03-22 DIAGNOSIS — L239 Allergic contact dermatitis, unspecified cause: Secondary | ICD-10-CM | POA: Diagnosis not present

## 2018-03-22 MED ORDER — FLUCONAZOLE 100 MG PO TABS: ORAL_TABLET | ORAL | 0 refills | Status: DC

## 2018-03-22 MED ORDER — BETAMETHASONE SOD PHOS & ACET 6 (3-3) MG/ML IJ SUSP
6.0000 mg | Freq: Once | INTRAMUSCULAR | Status: AC
  Administered 2018-03-22: 6 mg via INTRAMUSCULAR

## 2018-03-22 NOTE — Patient Instructions (Signed)
Zyrtec 10mg  1 PO daily for itch.

## 2018-03-22 NOTE — Progress Notes (Signed)
Chief Complaint  Patient presents with  . Rash    pt here today c/o rash on inner thighs and under arms. He was seen with Dr Dettinger 3 days ago and given Diflucan but states the rash isn't any better.    HPI  Patient presents today for red rash not better. Cream helps for a short time, then symptoms return worse than ever  PMH: Smoking status noted ROS: Per HPI  Objective: BP 107/62   Pulse 63   Temp (!) 97.3 F (36.3 C) (Oral)   Ht 5\' 10"  (1.778 m)   Wt 179 lb 4 oz (81.3 kg)   BMI 25.72 kg/m  Gen: NAD, alert, cooperative with exam HEENT: NCAT, EOMI, PERRL CV: RRR, good S1/S2, no murmur Resp: CTABL, no wheezes, non-labored Skin;near confluent erythema covering upper 1/3 of each inner thigh. Maculopapular eruption at each axilla Ext: No edema, warm Neuro: Alert and oriented, No gross deficits  Assessment and plan:  1. Rash   2. Contact allergic reaction     Meds ordered this encounter  Medications  . betamethasone acetate-betamethasone sodium phosphate (CELESTONE) injection 6 mg  . fluconazole (DIFLUCAN) 100 MG tablet    Sig: Take two with first dose. Then starting the next day take one daily until all are taken.    Dispense:  15 tablet    Refill:  0      Follow up as needed.  [name], MD

## 2018-03-23 ENCOUNTER — Ambulatory Visit: Payer: No Typology Code available for payment source | Admitting: Family Medicine

## 2018-03-25 ENCOUNTER — Encounter: Payer: Self-pay | Admitting: Family Medicine

## 2018-10-01 ENCOUNTER — Other Ambulatory Visit: Payer: Self-pay

## 2018-10-02 ENCOUNTER — Other Ambulatory Visit: Payer: Self-pay

## 2018-10-03 ENCOUNTER — Telehealth: Payer: Self-pay | Admitting: Cardiology

## 2018-10-03 ENCOUNTER — Encounter: Payer: Self-pay | Admitting: Family Medicine

## 2018-10-03 ENCOUNTER — Ambulatory Visit (INDEPENDENT_AMBULATORY_CARE_PROVIDER_SITE_OTHER): Payer: No Typology Code available for payment source | Admitting: Family Medicine

## 2018-10-03 VITALS — BP 106/64 | HR 61 | Temp 97.5°F | Ht 70.0 in | Wt 197.0 lb

## 2018-10-03 DIAGNOSIS — R079 Chest pain, unspecified: Secondary | ICD-10-CM | POA: Diagnosis not present

## 2018-10-03 NOTE — Progress Notes (Signed)
Subjective:  Patient ID: Fernando [name], male    DOB: 10-22-1960  Age: 58 y.o. MRN: [id number]  CC: Chest Pain   HPI Fernando [name] presents for chest pain.  This occurred primarily 4 days ago.  He was driving in a car on his way to visit family.  He noted a precordial pain as a "discomfort that he has trouble defining further.  It stayed with him for about 20 minutes.  He kept driving and just tried to work it out by moving his arm and shoulder around on the left.  It never radiated.  It never caused him to become nauseous or sweaty or short of breath.  It resolved spontaneously.  However there has been an ongoing soreness in the region ever since that day.  This has not resolved but is doing great and not bothersome.  Patient does have a positive family history for MI x3 in his grandfather.  No other relatives have had cardiovascular or cerebrovascular disease to his knowledge.  Other risk factors include being male over age 25 and elevated cholesterol.  He has resisted treatment for the cholesterol in the past but is interested in dimension today after discussion.  He would like to have his cholesterol checked.  Depression screen [facility] 2/9 10/03/2018 03/22/2018 03/19/2018  Decreased Interest 0 0 0  Down, Depressed, Hopeless 0 0 0  PHQ - 2 Score 0 0 0    History Fernando [name] has a past medical history of History of kidney stones.   He has a past surgical history that includes Shoulder arthroscopy; Cystoscopy w/ ureteral stent placement (Left, 06/05/2017); Extracorporeal shock wave lithotripsy (Left, 06/15/2017); and Inguinal hernia repair (Left, 12/2017).   His family history includes Alcohol abuse in his father; Cancer in his mother; Heart disease in his paternal grandfather.He reports that he has never smoked. He has never used smokeless tobacco. He reports that he does not drink alcohol or use drugs.    ROS Review of Systems  Constitutional: Negative.   HENT: Negative.   Eyes:  Negative for visual disturbance.  Respiratory: Negative for cough and shortness of breath.   Cardiovascular: Positive for chest pain. Negative for leg swelling.  Gastrointestinal: Negative for abdominal pain, diarrhea, nausea and vomiting.  Genitourinary: Negative for difficulty urinating.  Musculoskeletal: Negative for arthralgias and myalgias.  Skin: Negative for rash.  Neurological: Negative for headaches.  Psychiatric/Behavioral: Negative for sleep disturbance.    Objective:  BP 106/64   Pulse 61   Temp (!) 97.5 F (36.4 C) (Oral)   Ht _0  (1.778 m)   Wt 197 lb (89.4 kg)   BMI 28.27 kg/m   BP Readings from Last 3 Encounters:  10/03/18 106/64  03/22/18 107/62  03/19/18 122/68    Wt Readings from Last 3 Encounters:  10/03/18 197 lb (89.4 kg)  03/22/18 179 lb 4 oz (81.3 kg)  03/19/18 181 lb (82.1 kg)     Physical Exam Constitutional:      General: He is not in acute distress.    Appearance: He is well-developed.  HENT:     Head: Normocephalic and atraumatic.     Right Ear: External ear normal.     Left Ear: External ear normal.     Nose: Nose normal.  Eyes:     Conjunctiva/sclera: Conjunctivae normal.     Pupils: Pupils are equal, round, and reactive to light.  Neck:     Musculoskeletal: Normal range of motion and neck supple.  Cardiovascular:  Rate and Rhythm: Normal rate and regular rhythm.     Heart sounds: Normal heart sounds. No murmur.  Pulmonary:     Effort: Pulmonary effort is normal. No respiratory distress.     Breath sounds: Normal breath sounds. No wheezing or rales.  Abdominal:     Palpations: Abdomen is soft.     Tenderness: There is no abdominal tenderness.  Musculoskeletal: Normal range of motion.  Skin:    General: Skin is warm and dry.  Neurological:     Mental Status: He is alert and oriented to person, place, and time.     Deep Tendon Reflexes: Reflexes are normal and symmetric.  Psychiatric:        Behavior: Behavior normal.         Thought Content: Thought content normal.        Judgment: Judgment normal.     EKG:NSR, nml intervals and segments. Nml Axis, no ischemic changes  Assessment & Plan:   Fernando [name] was seen today for chest pain.  Diagnoses and all orders for this visit:  Chest pain, unspecified type -     EKG 12-Lead -     CBC with Differential/Platelet -     CMP14+EGFR -     Lipid panel -     Ambulatory referral to Cardiology       I have discontinued Judeth Cornfield. Talwar's fluconazole.  Allergies as of 10/03/2018   No Known Allergies     Medication List       Accurate as of October 03, 2018 11:53 AM. If you have any questions, ask your nurse or doctor.        STOP taking these medications   fluconazole 100 MG tablet Commonly known as: Diflucan Stopped by: Claretta Fraise, MD        Follow-up: No follow-ups on file.  Claretta Fraise, M.D.

## 2018-10-03 NOTE — Telephone Encounter (Signed)
LVM for patient to call and schedule new patient appt for CP.

## 2018-10-04 ENCOUNTER — Telehealth: Payer: Self-pay | Admitting: Family Medicine

## 2018-10-04 LAB — CBC WITH DIFFERENTIAL/PLATELET
Basophils Absolute: 0 10*3/uL (ref 0.0–0.2)
Basos: 1 %
EOS (ABSOLUTE): 0.1 10*3/uL (ref 0.0–0.4)
Eos: 2 %
Hematocrit: 44.1 % (ref 37.5–51.0)
Hemoglobin: 15.2 g/dL (ref 13.0–17.7)
Immature Grans (Abs): 0 10*3/uL (ref 0.0–0.1)
Immature Granulocytes: 0 %
Lymphocytes Absolute: 1.8 10*3/uL (ref 0.7–3.1)
Lymphs: 29 %
MCH: 30.8 pg (ref 26.6–33.0)
MCHC: 34.5 g/dL (ref 31.5–35.7)
MCV: 89 fL (ref 79–97)
Monocytes Absolute: 0.6 10*3/uL (ref 0.1–0.9)
Monocytes: 9 %
Neutrophils Absolute: 3.7 10*3/uL (ref 1.4–7.0)
Neutrophils: 59 %
Platelets: 259 10*3/uL (ref 150–450)
RBC: 4.94 x10E6/uL (ref 4.14–5.80)
RDW: 12.1 % (ref 11.6–15.4)
WBC: 6.1 10*3/uL (ref 3.4–10.8)

## 2018-10-04 LAB — CMP14+EGFR
ALT: 18 IU/L (ref 0–44)
AST: 20 IU/L (ref 0–40)
Albumin/Globulin Ratio: 2.4 — ABNORMAL HIGH (ref 1.2–2.2)
Albumin: 4.6 g/dL (ref 3.8–4.9)
Alkaline Phosphatase: 60 IU/L (ref 39–117)
BUN/Creatinine Ratio: 13 (ref 9–20)
BUN: 16 mg/dL (ref 6–24)
Bilirubin Total: 0.5 mg/dL (ref 0.0–1.2)
CO2: 24 mmol/L (ref 20–29)
Calcium: 9.1 mg/dL (ref 8.7–10.2)
Chloride: 104 mmol/L (ref 96–106)
Creatinine, Ser: 1.2 mg/dL (ref 0.76–1.27)
GFR calc Af Amer: 77 mL/min/{1.73_m2} (ref >59)
GFR calc non Af Amer: 67 mL/min/{1.73_m2} (ref >59)
Globulin, Total: 1.9 g/dL (ref 1.5–4.5)
Glucose: 98 mg/dL (ref 65–99)
Potassium: 4.4 mmol/L (ref 3.5–5.2)
Sodium: 139 mmol/L (ref 134–144)
Total Protein: 6.5 g/dL (ref 6.0–8.5)

## 2018-10-04 LAB — LIPID PANEL
Chol/HDL Ratio: 5.6 ratio — ABNORMAL HIGH (ref 0.0–5.0)
Cholesterol, Total: 268 mg/dL — ABNORMAL HIGH (ref 100–199)
HDL: 48 mg/dL (ref >39)
LDL Chol Calc (NIH): 202 mg/dL — ABNORMAL HIGH (ref 0–99)
Triglycerides: 104 mg/dL (ref 0–149)
VLDL Cholesterol Cal: 18 mg/dL (ref 5–40)

## 2018-10-04 MED ORDER — ATORVASTATIN CALCIUM 40 MG PO TABS: 40.0000 mg | ORAL_TABLET | Freq: Every day | ORAL | 1 refills | Status: DC

## 2018-10-04 NOTE — Telephone Encounter (Signed)
Pt aware of labs  

## 2018-10-10 ENCOUNTER — Encounter: Payer: Self-pay | Admitting: *Deleted

## 2018-10-10 ENCOUNTER — Other Ambulatory Visit: Payer: Self-pay | Admitting: *Deleted

## 2018-11-07 ENCOUNTER — Encounter: Payer: Self-pay | Admitting: Cardiovascular Disease

## 2018-11-07 ENCOUNTER — Other Ambulatory Visit: Payer: Self-pay

## 2018-11-07 ENCOUNTER — Ambulatory Visit (INDEPENDENT_AMBULATORY_CARE_PROVIDER_SITE_OTHER): Payer: No Typology Code available for payment source | Admitting: Cardiovascular Disease

## 2018-11-07 VITALS — BP 118/76 | HR 81 | Temp 97.9°F | Ht 70.0 in | Wt 186.6 lb

## 2018-11-07 DIAGNOSIS — R079 Chest pain, unspecified: Secondary | ICD-10-CM | POA: Diagnosis not present

## 2018-11-07 DIAGNOSIS — E785 Hyperlipidemia, unspecified: Secondary | ICD-10-CM

## 2018-11-07 NOTE — Patient Instructions (Signed)
Medication Instructions:  Your physician recommends that you continue on your current medications as directed. Please refer to the Current Medication list given to you today.  If you need a refill on your cardiac medications before your next appointment, please call your pharmacy.   Lab work: Your physician recommends that you return for a FASTING lipid profile when you return for your stress test. No appointment needed for blood work, please bring your lab slips with you.  If you have labs (blood work) drawn today and your tests are completely normal, you will receive your results only by: Marland Kitchen MyChart Message (if you have MyChart) OR . A paper copy in the mail If you have any lab test that is abnormal or we need to change your treatment, we will call you to review the results.  Testing/Procedures: Your physician has requested that you have a lexiscan myoview. For further information please visit [email or web address]. Please follow instruction sheet, as given.   Follow-Up: At [facility], you and your health needs are our priority.  As part of our continuing mission to provide you with exceptional heart care, we have created designated Provider Care Teams.  These Care Teams include your primary Cardiologist (physician) and Advanced Practice Providers (APPs -  Physician Assistants and Nurse Practitioners) who all work together to provide you with the care you need, when you need it. . Please schedule a follow-up appointment to see Dr. Audie Box in 3 months.

## 2018-11-07 NOTE — Progress Notes (Signed)
Cardiology Office Note:   Date:  11/07/2018  NAME:  Fernando [name]    MRN: [id number] DOB:  [birth year]   PCP:  Fernando Fraise, Fernando [name]  Cardiologist:  Fernando Field, Fernando [name]  Electrophysiologist:  None   Referring Fernando [name]: Fernando Fraise, Fernando [name]   Chief Complaint  Patient presents with  . Chest Pain   History of Present Illness:   Fernando [name] is a 58 y.o. male with a hx of kidney stones who is being seen today for the evaluation of chest pain at the request of Fernando Fraise, Fernando [name]. EKG at PCP office unremarkable.  He reports around 3 to 4 weeks ago, while driving to a family event, he experienced chest discomfort.  This chest discomfort was described as a dull achy pain in his left chest.  It lasted about 20 to 25 minutes and resolve without intervention.  He reports it was not worsened by anything, he just felt like he cannot get comfortable.  He states he has had chest pain some years ago, but this was a little alarming for him given the duration.  He does report a history of anxiety.  He was recently evaluated by his primary care physician who recommended he undergo stress testing.  He had a lipid profile checked, that showed an LDL of 200.  Interestingly, the LDL in February of this year was around 100.  There appears to be some discrepancy with the testing.  Regarding cardiovascular disease risk factors, he is a non-smoker, rarely consumes alcohol, and is physically active.  He reports at least 30 to 45 minutes of aerobic activity a few times a week.  He works in Biomedical scientist, and is able to complete a daily workload without any limitations.  He reports history of MI and his grandfather.  He expresses concerns and would like to make sure his heart is okay.  Past Medical History: Past Medical History:  Diagnosis Date  . History of kidney stones   . Hyperlipidemia     Past Surgical History: Past Surgical History:  Procedure Laterality Date  . CYSTOSCOPY W/ URETERAL STENT PLACEMENT Left 06/05/2017    Procedure: CYSTOSCOPY WITH RETROGRADE PYELOGRAM/URETERAL STENT PLACEMENT;  Surgeon: Fernando Mons, Fernando [name];  Location: [facility];  Service: Urology;  Laterality: Left;  . EXTRACORPOREAL SHOCK WAVE LITHOTRIPSY Left 06/15/2017   Procedure: LEFT EXTRACORPOREAL SHOCK WAVE LITHOTRIPSY (ESWL);  Surgeon: Fernando Loser, Fernando [name];  Location: [facility];  Service: Urology;  Laterality: Left;  . INGUINAL HERNIA REPAIR Left 12/2017   Went to clinic in [place]  . SHOULDER ARTHROSCOPY     left shoulder     Current Medications: Current Meds  Medication Sig  . atorvastatin (LIPITOR) 40 MG tablet Take 1 tablet (40 mg total) by mouth daily.     Allergies:    Patient has no known allergies.   Social History: Social History   Socioeconomic History  . Marital status: Married    Spouse name: Fernando [name]  . Number of children: Not on file  . Years of education: Not on file  . Highest education level: Not on file  Occupational History  . Occupation: landscape    Comment: self employeed  Social Needs  . Financial resource strain: Not on file  . Food insecurity    Worry: Not on file    Inability: Not on file  . Transportation needs    Medical: Not on file    Non-medical: Not on file  Tobacco Use  . Smoking status: Never Smoker  .  Smokeless tobacco: Never Used  Substance and Sexual Activity  . Alcohol use: Never    Frequency: Never  . Drug use: Never  . Sexual activity: Never  Lifestyle  . Physical activity    Days per week: Not on file    Minutes per session: Not on file  . Stress: Not on file  Relationships  . Social Musician on phone: Not on file    Gets together: Not on file    Attends religious service: Not on file    Active member of club or organization: Not on file    Attends meetings of clubs or organizations: Not on file    Relationship status: Not on file  Other Topics Concern  . Not on file  Social History Narrative  . Not on file     Family History: The  patient's family history includes Alcohol abuse in his father; Cancer in his mother; Heart disease in his paternal grandfather.  ROS:   All other ROS reviewed and negative. Pertinent positives noted in the HPI.     EKGs/Labs/Other Studies Reviewed:   The following studies were personally reviewed by me today:  EKG:  EKG is ordered today.  The ekg ordered today demonstrates normal sinus rhythm, incomplete right bundle branch block, heart rate 67, frequent PVCs noted, nonspecific T abnormalities, and was personally reviewed by me.   Recent Labs: 10/03/2018: ALT 18; BUN 16; Creatinine, Ser 1.20; Hemoglobin 15.2; Platelets 259; Potassium 4.4; Sodium 139   Recent Lipid Panel    Component Value Date/Time   CHOL 268 (H) 10/03/2018 0858   TRIG 104 10/03/2018 0858   HDL 48 10/03/2018 0858   CHOLHDL 5.6 (H) 10/03/2018 0858   LDLCALC 202 (H) 10/03/2018 0858    Physical Exam:   VS:  BP 118/76   Pulse 81   Temp 97.9 F (36.6 C)   Ht 5\' 10"  (1.778 m)   Wt 186 lb 9.6 oz (84.6 kg)   SpO2 95%   BMI 26.77 kg/m    Wt Readings from Last 3 Encounters:  11/07/18 186 lb 9.6 oz (84.6 kg)  10/03/18 197 lb (89.4 kg)  03/22/18 179 lb 4 oz (81.3 kg)    General: Well nourished, well developed, in no acute distress Heart: Atraumatic, normal size  Eyes: PEERLA, EOMI  Neck: Supple, no JVD Endocrine: No thryomegaly Cardiac: Normal S1, S2; RRR; no murmurs, rubs, or gallops Lungs: Clear to auscultation bilaterally, no wheezing, rhonchi or rales  Abd: Soft, nontender, no hepatomegaly  Ext: No edema, pulses 2+ Musculoskeletal: No deformities, BUE and BLE strength normal and equal Skin: Warm and dry, no rashes   Neuro: Alert and oriented to person, place, time, and situation, CNII-XII grossly intact, no focal deficits  Psych: Normal mood and affect   ASSESSMENT:   NAME@ is a 58 y.o. male who presents for the following: 1. Chest pain of uncertain etiology   2. Hyperlipidemia, unspecified hyperlipidemia  type     PLAN:   1. Chest pain of uncertain etiology -Atypical symptoms, but risk factors include family history as well as hyperlipidemia.  We will proceed with a Lexiscan nuclear medicine myocardial perfusion imaging study.  His EKG does demonstrate sinus rhythm with nonspecific T wave abnormalities.  I think this is is best to proceed with a stress test.  2. Hyperlipidemia, unspecified hyperlipidemia type -LDL in February was around 100, now is up to 200.  There appears to be some discrepancy in these recent  values.  We will just recheck a lipid profile when he comes for his stress test.   Disposition: Return in about 3 months (around 02/07/2019).  Medication Adjustments/Labs and Tests Ordered: Current medicines are reviewed at length with the patient today.  Concerns regarding medicines are outlined above.  Orders Placed This Encounter  Procedures  . Lipid panel  . MYOCARDIAL PERFUSION IMAGING  . EKG 12-Lead   No orders of the defined types were placed in this encounter.   Patient Instructions  Medication Instructions:  Your physician recommends that you continue on your current medications as directed. Please refer to the Current Medication list given to you today.  If you need a refill on your cardiac medications before your next appointment, please call your pharmacy.   Lab work: Your physician recommends that you return for a FASTING lipid profile when you return for your stress test. No appointment needed for blood work, please bring your lab slips with you.  If you have labs (blood work) drawn today and your tests are completely normal, you will receive your results only by: Marland Kitchen. MyChart Message (if you have MyChart) OR . A paper copy in the mail If you have any lab test that is abnormal or we need to change your treatment, we will call you to review the results.  Testing/Procedures: Your physician has requested that you have a lexiscan myoview. For further information  please visit [email or web address]. Please follow instruction sheet, as given.   Follow-Up: At [facility], you and your health needs are our priority.  As part of our continuing mission to provide you with exceptional heart care, we have created designated Provider Care Teams.  These Care Teams include your primary Cardiologist (physician) and Advanced Practice Providers (APPs -  Physician Assistants and Nurse Practitioners) who all work together to provide you with the care you need, when you need it. . Please schedule a follow-up appointment to see Fernando [name]. Flora Lipps'Neal in 3 months.      Fernando [name], Lenna GilfordWesley T. Flora Lipps'Neal, Fernando [name] [facility]  [address], [address] [place], [place] [address] [phone]  11/07/2018 2:00 PM

## 2018-11-09 ENCOUNTER — Telehealth (HOSPITAL_COMMUNITY): Payer: Self-pay

## 2018-11-09 NOTE — Telephone Encounter (Signed)
Encounter complete. 

## 2018-11-13 ENCOUNTER — Ambulatory Visit (HOSPITAL_COMMUNITY)
Admission: RE | Admit: 2018-11-13 | Discharge: 2018-11-13 | Disposition: A | Discharge status: Home / Self Care | Payer: No Typology Code available for payment source | Source: Ambulatory Visit | Attending: Cardiovascular Disease | Admitting: Cardiovascular Disease

## 2018-11-13 ENCOUNTER — Other Ambulatory Visit: Payer: Self-pay

## 2018-11-13 DIAGNOSIS — R079 Chest pain, unspecified: Secondary | ICD-10-CM | POA: Diagnosis present

## 2018-11-13 LAB — MYOCARDIAL PERFUSION IMAGING
LV dias vol: 138 mL (ref 62–150)
LV sys vol: 72 mL
Peak HR: 98 {beats}/min
Rest HR: 58 {beats}/min
SDS: 1
SRS: 1
SSS: 2
TID: 1.1

## 2018-11-13 MED ORDER — TECHNETIUM TC 99M TETROFOSMIN IV KIT
10.8000 | PACK | Freq: Once | INTRAVENOUS | Status: AC | PRN
  Administered 2018-11-13: 10.8 via INTRAVENOUS
  Filled 2018-11-13: qty 11

## 2018-11-13 MED ORDER — TECHNETIUM TC 99M TETROFOSMIN IV KIT
31.4000 | PACK | Freq: Once | INTRAVENOUS | Status: AC | PRN
  Administered 2018-11-13: 31.4 via INTRAVENOUS
  Filled 2018-11-13: qty 32

## 2018-11-13 MED ORDER — REGADENOSON 0.4 MG/5ML IV SOLN
0.4000 mg | Freq: Once | INTRAVENOUS | Status: AC
  Administered 2018-11-13: 0.4 mg via INTRAVENOUS

## 2018-11-14 ENCOUNTER — Telehealth: Payer: Self-pay | Admitting: Cardiovascular Disease

## 2018-11-14 ENCOUNTER — Other Ambulatory Visit: Payer: Self-pay | Admitting: Cardiovascular Disease

## 2018-11-14 DIAGNOSIS — R079 Chest pain, unspecified: Secondary | ICD-10-CM

## 2018-11-14 NOTE — Telephone Encounter (Signed)
Attempted to call Mr. Mansouri about normal stress test. No answer.  Evalina Field, MD

## 2018-11-14 NOTE — Progress Notes (Signed)
Ordering echo for abnormal EF on NM stress.   Evalina Field, MD

## 2018-11-19 ENCOUNTER — Encounter (HOSPITAL_COMMUNITY): Payer: Self-pay | Admitting: Cardiovascular Disease

## 2018-11-27 ENCOUNTER — Telehealth (HOSPITAL_COMMUNITY): Payer: Self-pay

## 2018-11-27 NOTE — Telephone Encounter (Signed)
New message    Just an FYI. We have made several attempts to contact this patient including sending a letter to schedule or reschedule their echocardiogram. We will be removing the patient from the echo WQ.  10.22.20 @ 2:51pm  both # are the same lm on vm - Fernando [name]  10.19.20 mail reminder letter Fernando [name]  10.15.20 @ 11:35am lm on home vm - Fernando [name]

## 2018-11-27 NOTE — Telephone Encounter (Signed)
Routing to MD and primary.

## 2018-12-31 ENCOUNTER — Other Ambulatory Visit: Payer: Self-pay

## 2019-01-02 ENCOUNTER — Other Ambulatory Visit: Payer: Self-pay

## 2019-01-02 ENCOUNTER — Ambulatory Visit (INDEPENDENT_AMBULATORY_CARE_PROVIDER_SITE_OTHER): Payer: No Typology Code available for payment source | Admitting: Family Medicine

## 2019-01-02 ENCOUNTER — Encounter: Payer: Self-pay | Admitting: Family Medicine

## 2019-01-02 ENCOUNTER — Ambulatory Visit: Payer: No Typology Code available for payment source | Admitting: Family Medicine

## 2019-01-04 ENCOUNTER — Encounter: Payer: Self-pay | Admitting: Family Medicine

## 2019-01-04 NOTE — Progress Notes (Signed)
erroneous

## 2019-01-07 ENCOUNTER — Encounter: Payer: Self-pay | Admitting: Family Medicine

## 2019-01-07 ENCOUNTER — Ambulatory Visit (INDEPENDENT_AMBULATORY_CARE_PROVIDER_SITE_OTHER): Payer: No Typology Code available for payment source | Admitting: Family Medicine

## 2019-01-07 NOTE — Progress Notes (Signed)
LMOVM X 2  Erroneous encounter

## 2019-02-04 ENCOUNTER — Telehealth: Payer: Self-pay | Admitting: Family Medicine

## 2019-02-04 NOTE — Telephone Encounter (Signed)
Appointment given for Thursday 01/07 @ 2:55pm with Stacks.

## 2019-02-06 ENCOUNTER — Other Ambulatory Visit: Payer: Self-pay

## 2019-02-07 ENCOUNTER — Ambulatory Visit (INDEPENDENT_AMBULATORY_CARE_PROVIDER_SITE_OTHER): Payer: No Typology Code available for payment source | Admitting: Family Medicine

## 2019-02-07 ENCOUNTER — Encounter: Payer: Self-pay | Admitting: Family Medicine

## 2019-02-07 VITALS — BP 101/68 | HR 90 | Temp 98.4°F | Resp 20 | Ht 70.0 in | Wt 198.0 lb

## 2019-02-07 DIAGNOSIS — M2550 Pain in unspecified joint: Secondary | ICD-10-CM | POA: Diagnosis not present

## 2019-02-07 MED ORDER — PREDNISONE 10 MG PO TABS: ORAL_TABLET | ORAL | 0 refills | Status: DC

## 2019-02-07 NOTE — Progress Notes (Signed)
Subjective:  Patient ID: Fernando [name], male    DOB: 04-22-1960  Age: 59 y.o. MRN: [id number]  CC: Joint Pain (wants LYME test)   HPI Fernando [name] presents for over a year oof continual joint pain. References alll of the appendicular joints, not the axial. Pain is moderate. Has caused him to stop his exercise routine with him home weght lifting set up. He DCed his statin use for cholesterol because it made the pain worse. Improvement was notable but not sufficient after the med was [facility]. He states the pain I interrupting his sleep.  Depression screen [facility] 2/9 02/07/2019 01/02/2019 10/03/2018  Decreased Interest 0 0 0  Down, Depressed, Hopeless 0 0 0  PHQ - 2 Score 0 0 0    History Fernando [name] has a past medical history of History of kidney stones and Hyperlipidemia.   He has a past surgical history that includes Shoulder arthroscopy; Cystoscopy w/ ureteral stent placement (Left, 06/05/2017); Extracorporeal shock wave lithotripsy (Left, 06/15/2017); and Inguinal hernia repair (Left, 12/2017).   His family history includes Alcohol abuse in his father; Cancer in his mother; Heart disease in his paternal grandfather.He reports that he has never smoked. He has never used smokeless tobacco. He reports that he does not drink alcohol or use drugs.    ROS Review of Systems  Constitutional: Negative for fever.  Respiratory: Negative for shortness of breath.   Cardiovascular: Negative for chest pain.  Musculoskeletal: Negative for arthralgias.  Skin: Negative for rash.    Objective:  BP 101/68   Pulse 90   Temp 98.4 F (36.9 C)   Resp 20   Ht 5\' 10"  (1.778 m)   Wt 198 lb (89.8 kg)   SpO2 98%   BMI 28.41 kg/m   BP Readings from Last 3 Encounters:  02/07/19 101/68  01/02/19 125/81  11/07/18 118/76    Wt Readings from Last 3 Encounters:  02/07/19 198 lb (89.8 kg)  01/02/19 199 lb 12.8 oz (90.6 kg)  11/13/18 186 lb (84.4 kg)     Physical Exam Vitals reviewed.    Constitutional:      Appearance: He is well-developed.  HENT:     Head: Normocephalic and atraumatic.     Right Ear: Tympanic membrane and external ear normal. No decreased hearing noted.     Left Ear: Tympanic membrane and external ear normal. No decreased hearing noted.     Mouth/Throat:     Pharynx: No oropharyngeal exudate or posterior oropharyngeal erythema.  Eyes:     Pupils: Pupils are equal, round, and reactive to light.  Cardiovascular:     Rate and Rhythm: Normal rate and regular rhythm.     Heart sounds: No murmur.  Pulmonary:     Effort: No respiratory distress.     Breath sounds: Normal breath sounds.  Abdominal:     Palpations: Abdomen is soft.     Tenderness: There is no abdominal tenderness.  Musculoskeletal:        General: No swelling or deformity. Normal range of motion.     Cervical back: Normal range of motion and neck supple.  Skin:    General: Skin is warm and dry.       Assessment & Plan:   [name] was seen today for joint pain.  Diagnoses and all orders for this visit:  Arthralgia, unspecified joint -     Lyme Ab -     Sedimentation rate -     citrulline -  Arthritis Panel -     Rheumatology  Other orders -     predniSONE (DELTASONE) 10 MG tablet; Take 5 daily for 3 days followed by 4,3,2 and 1 for 3 days each.   For now he will manage the sleep issue with work on pain control, sleep hygiene and OTC meds as needed. His labs show at least some tendency for RA, so referral is made to rheumatology.     I am having Fernando [name]. [name] start on predniSONE.  Allergies as of 02/07/2019   No Known Allergies     Medication List       Accurate as of February 07, 2019 11:59 PM. If you have any questions, ask your nurse or doctor.        predniSONE 10 MG tablet Commonly known as: DELTASONE Take 5 daily for 3 days followed by 4,3,2 and 1 for 3 days each. Started by: Claretta Fraise, MD        Follow-up: Return in about 6 months  (around 08/07/2019), or if symptoms worsen or fail to improve.  Claretta Fraise, M.D.

## 2019-02-09 LAB — ARTHRITIS PANEL
Basophils Absolute: 0 10*3/uL (ref 0.0–0.2)
Basos: 1 %
EOS (ABSOLUTE): 0.1 10*3/uL (ref 0.0–0.4)
Eos: 2 %
Hematocrit: 45.8 % (ref 37.5–51.0)
Hemoglobin: 15.5 g/dL (ref 13.0–17.7)
Immature Grans (Abs): 0 10*3/uL (ref 0.0–0.1)
Immature Granulocytes: 0 %
Lymphocytes Absolute: 1.6 10*3/uL (ref 0.7–3.1)
Lymphs: 27 %
MCH: 30.4 pg (ref 26.6–33.0)
MCHC: 33.8 g/dL (ref 31.5–35.7)
MCV: 90 fL (ref 79–97)
Monocytes Absolute: 0.7 10*3/uL (ref 0.1–0.9)
Monocytes: 11 %
Neutrophils Absolute: 3.5 10*3/uL (ref 1.4–7.0)
Neutrophils: 59 %
Platelets: 278 10*3/uL (ref 150–450)
RBC: 5.1 x10E6/uL (ref 4.14–5.80)
RDW: 12 % (ref 11.6–15.4)
Rhuematoid fact SerPl-aCnc: 28 IU/mL — ABNORMAL HIGH (ref 0.0–13.9)
Sed Rate: 2 mm/hr (ref 0–30)
Uric Acid: 4.7 mg/dL (ref 3.8–8.4)
WBC: 5.9 10*3/uL (ref 3.4–10.8)

## 2019-02-09 LAB — LYME AB/WESTERN BLOT REFLEX
LYME DISEASE AB, QUANT, IGM: <0.8 index (ref 0.00–0.79)
Lyme IgG/IgM Ab: <0.91 {ISR} (ref 0.00–0.90)

## 2019-02-09 LAB — CYCLIC CITRUL PEPTIDE ANTIBODY, IGG/IGA: Cyclic Citrullin Peptide Ab: 4 units (ref 0–19)

## 2019-02-10 ENCOUNTER — Encounter: Payer: Self-pay | Admitting: Family Medicine

## 2019-02-11 NOTE — Progress Notes (Signed)
Busy

## 2019-03-05 ENCOUNTER — Other Ambulatory Visit: Payer: Self-pay

## 2019-03-06 ENCOUNTER — Ambulatory Visit (INDEPENDENT_AMBULATORY_CARE_PROVIDER_SITE_OTHER): Payer: No Typology Code available for payment source | Admitting: Family Medicine

## 2019-03-06 ENCOUNTER — Encounter: Payer: Self-pay | Admitting: Family Medicine

## 2019-03-06 ENCOUNTER — Encounter: Payer: Self-pay | Admitting: *Deleted

## 2019-03-06 VITALS — BP 118/77 | HR 72 | Temp 96.5°F | Ht 70.0 in | Wt 195.0 lb

## 2019-03-06 DIAGNOSIS — Z125 Encounter for screening for malignant neoplasm of prostate: Secondary | ICD-10-CM

## 2019-03-06 DIAGNOSIS — N200 Calculus of kidney: Secondary | ICD-10-CM | POA: Insufficient documentation

## 2019-03-06 DIAGNOSIS — M2550 Pain in unspecified joint: Secondary | ICD-10-CM | POA: Diagnosis not present

## 2019-03-06 NOTE — Progress Notes (Signed)
Subjective:  Patient ID: Fernando [name], male    DOB: 09-03-1960  Age: 59 y.o. MRN: [id number]  CC: Annual Exam   HPI [name] presents for follow up on his hip and other joint pain. He had a positive RA and wants further understanding. He declines to have a physical (CPE).  He states the pains are in the anterior flexor region of the hips.  He also has bilateral elbow pain.  He has ankle pain on the left side.  He feels that the elbow and that ankle are related to old wrestling injuries from high school.  The pains have been going on intermittently and increasing for about a year now.  Patient discontinued the statin due to joint pain and muscle aches.  Depression screen [facility] 2/9 03/06/2019 02/07/2019 01/02/2019  Decreased Interest 0 0 0  Down, Depressed, Hopeless 0 0 0  PHQ - 2 Score 0 0 0    History [name] has a past medical history of History of kidney stones and Hyperlipidemia.   He has a past surgical history that includes Shoulder arthroscopy; Cystoscopy w/ ureteral stent placement (Left, 06/05/2017); Extracorporeal shock wave lithotripsy (Left, 06/15/2017); and Inguinal hernia repair (Left, 12/2017).   His family history includes Alcohol abuse in his father; Cancer in his mother; Heart disease in his paternal grandfather.He reports that he has never smoked. He has never used smokeless tobacco. He reports that he does not drink alcohol or use drugs.    ROS Review of Systems  Constitutional: Negative for fever.  Respiratory: Negative for shortness of breath.   Cardiovascular: Negative for chest pain.  Gastrointestinal: Negative for abdominal pain.  Musculoskeletal: Positive for arthralgias.  Skin: Negative for rash.    Objective:  BP 118/77   Pulse 72   Temp (!) 96.5 F (35.8 C) (Temporal)   Ht _0  (1.778 m)   Wt 195 lb (88.5 kg)   BMI 27.98 kg/m   BP Readings from Last 3 Encounters:  03/06/19 118/77  02/07/19 101/68  01/02/19 125/81    Wt  Readings from Last 3 Encounters:  03/06/19 195 lb (88.5 kg)  02/07/19 198 lb (89.8 kg)  01/02/19 199 lb 12.8 oz (90.6 kg)     Physical Exam Vitals reviewed.  Constitutional:      Appearance: He is well-developed.  HENT:     Head: Normocephalic and atraumatic.     Right Ear: External ear normal.     Left Ear: External ear normal.     Mouth/Throat:     Pharynx: No oropharyngeal exudate or posterior oropharyngeal erythema.  Eyes:     Pupils: Pupils are equal, round, and reactive to light.  Cardiovascular:     Rate and Rhythm: Normal rate and regular rhythm.     Heart sounds: No murmur.  Pulmonary:     Effort: No respiratory distress.     Breath sounds: Normal breath sounds.  Musculoskeletal:     Cervical back: Normal range of motion and neck supple.  Neurological:     Mental Status: He is alert and oriented to person, place, and time.       Assessment & Plan:   Cinsere was seen today for annual exam.  Diagnoses and all orders for this visit:  Arthralgia, unspecified joint -     Rheumatoid Arthritis Profile -     Sedimentation rate -     HLA-B27 antigen  Screening for prostate cancer -     PSA Total (Reflex To  Free)    I have discontinued Judeth Cornfield. Guzek's predniSONE.  Allergies as of 03/06/2019   No Known Allergies     Medication List       Accurate as of March 06, 2019  5:44 PM. If you have any questions, ask your nurse or doctor.        STOP taking these medications   predniSONE 10 MG tablet Commonly known as: DELTASONE Stopped by: Claretta Fraise, MD        Follow-up: No follow-ups on file.  Claretta Fraise, M.D.

## 2019-03-12 LAB — RHEUMATOID ARTHRITIS PROFILE
Cyclic Citrullin Peptide Ab: 4 units (ref 0–19)
Rheumatoid fact SerPl-aCnc: 27.1 IU/mL — ABNORMAL HIGH (ref 0.0–13.9)

## 2019-03-12 LAB — PSA TOTAL (REFLEX TO FREE): Prostate Specific Ag, Serum: 1.1 ng/mL (ref 0.0–4.0)

## 2019-03-12 LAB — HLA-B27 ANTIGEN: HLA B27: NEGATIVE

## 2019-03-12 LAB — SEDIMENTATION RATE: Sed Rate: 4 mm/hr (ref 0–30)

## 2019-04-17 ENCOUNTER — Encounter: Payer: Self-pay | Admitting: Cardiovascular Disease

## 2019-05-08 IMAGING — CT CT ABD-PELV W/ CM
2 of 5 series · 16 of 46 positions shown, 18 images · IV contrast (iopamidol)
Comparison: Abdominal CT dated 02/06/2008

CLINICAL DATA: 56-year-old male with abdominal pain.

EXAM:
CT ABDOMEN AND PELVIS WITH CONTRAST
TECHNIQUE: Multidetector CT imaging of the abdomen and pelvis was performed
using the standard protocol following bolus administration of
intravenous contrast.
CONTRAST:  100mL WQ4ZJI-300 IOPAMIDOL (WQ4ZJI-300) INJECTION 61%

[Series 2: axial st · axial · 0.70mm/px · z∈[+1566,+1930]mm · 13 of 83 slices shown, 15 images]
[im 5/83  soft-tissue]
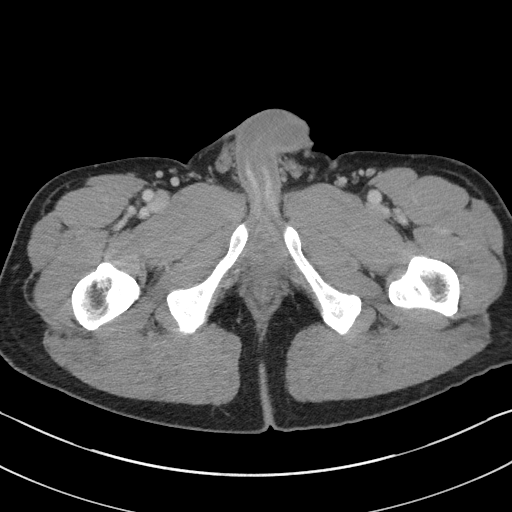
[im 5/83  bone]
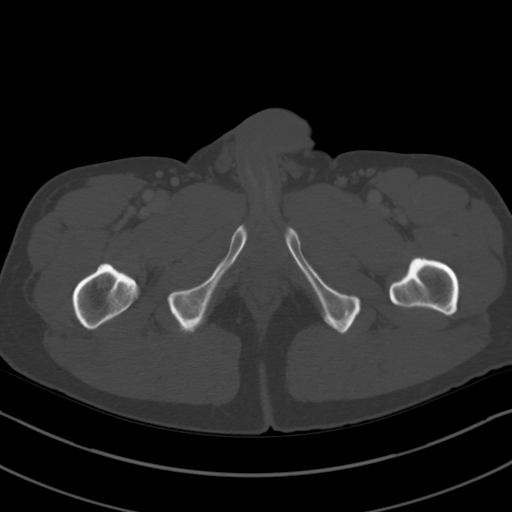
[im 10/83  soft-tissue]
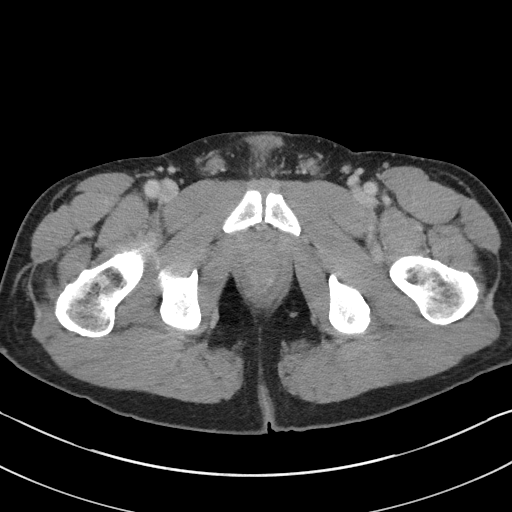
[im 20/83  soft-tissue]
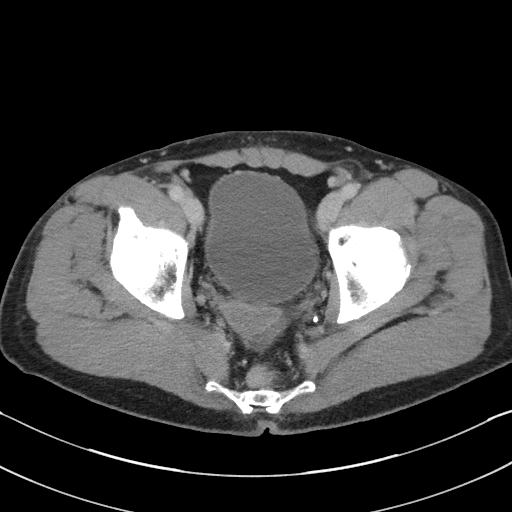
[im 25/83  soft-tissue]
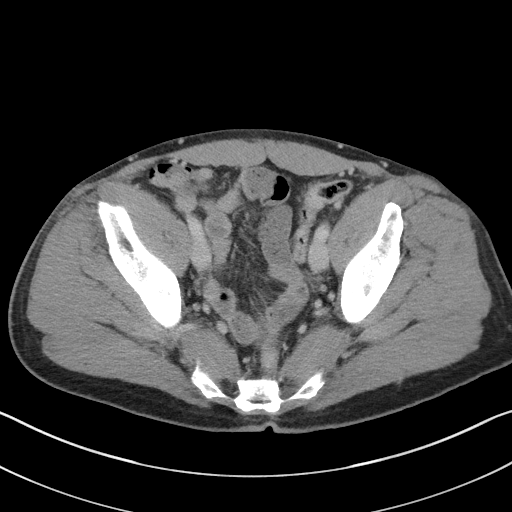
[im 29/83  soft-tissue]
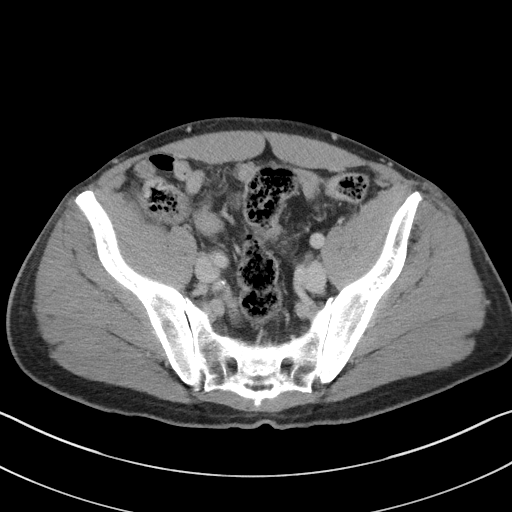
[im 34/83  soft-tissue]
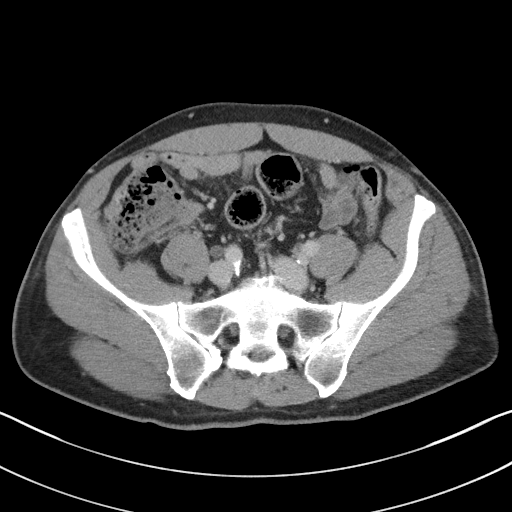
[im 44/83  soft-tissue]
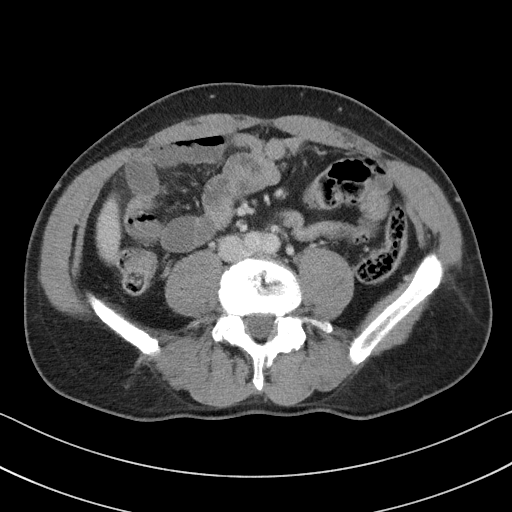
[im 49/83  soft-tissue]
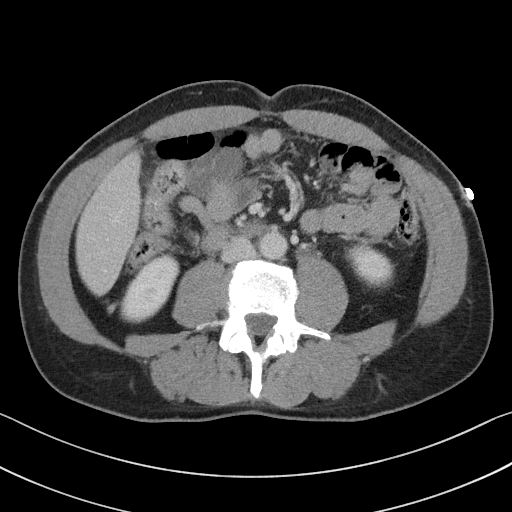
[im 54/83  soft-tissue]
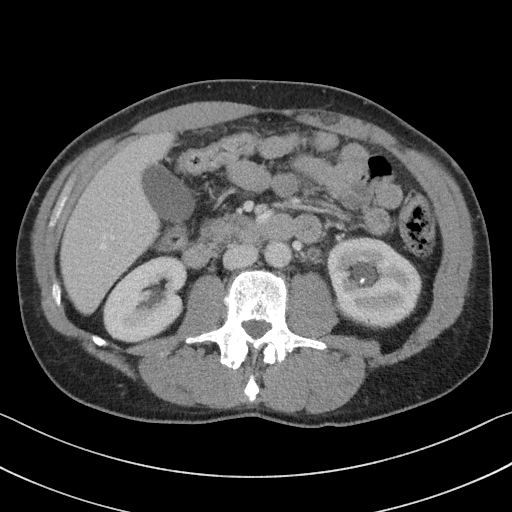
[im 54/83  bone]
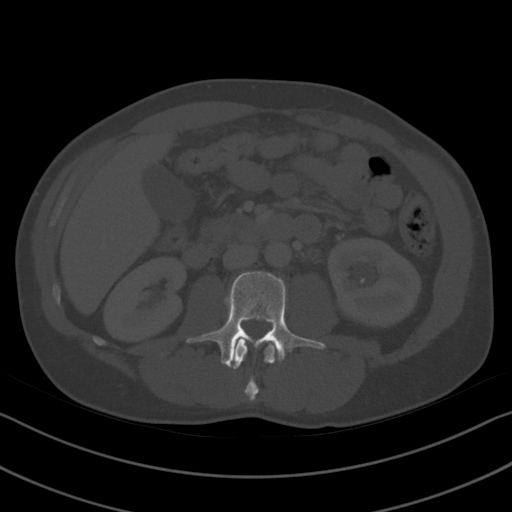
[im 58/83  soft-tissue]
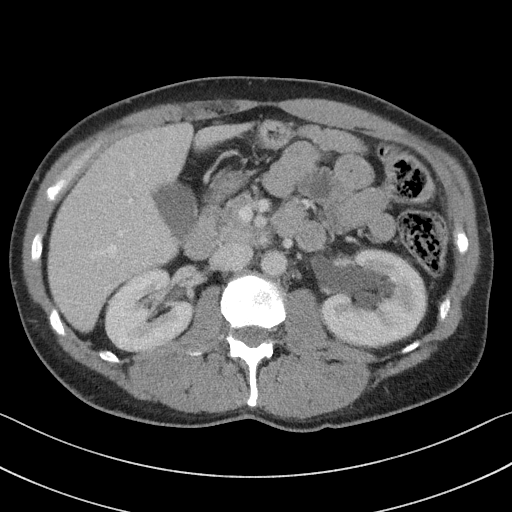
[im 63/83  soft-tissue]
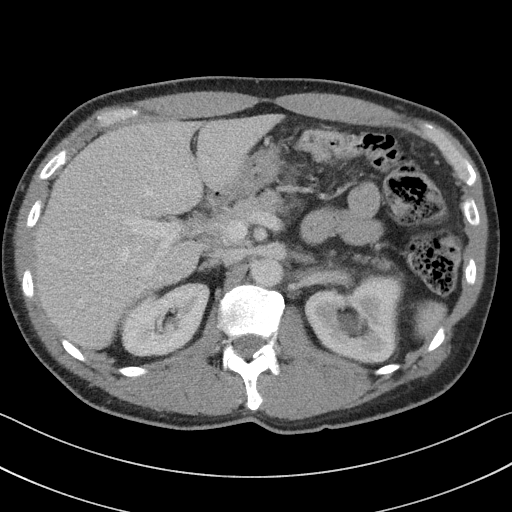
[im 73/83  soft-tissue]
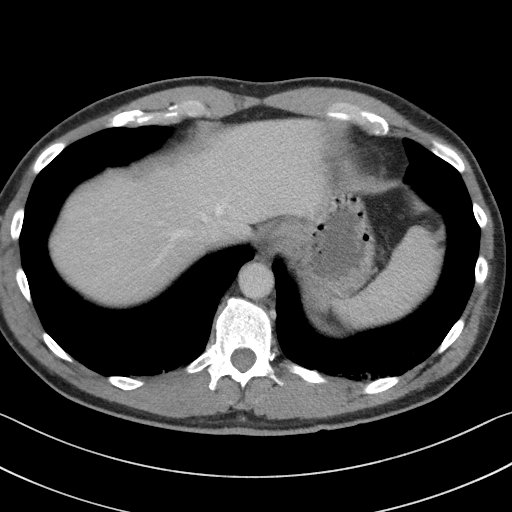
[im 78/83  soft-tissue]
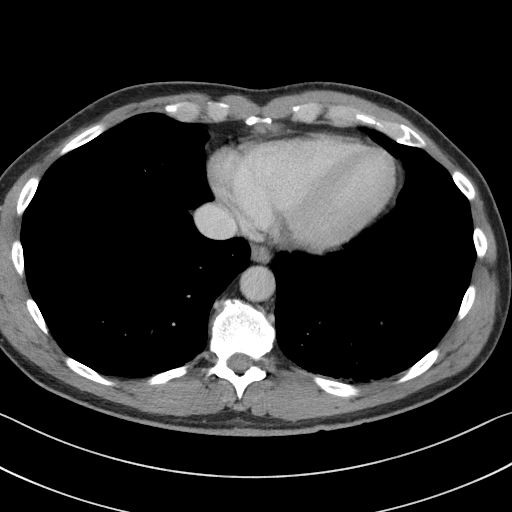

[Series 5: coronal st · coronal · 0.76mm/px · 3 of 90 slices shown]
[im 30/90  soft-tissue]
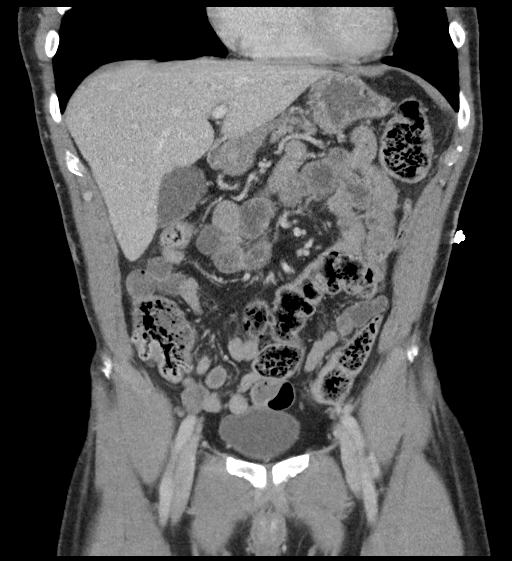
[im 40/90  soft-tissue]
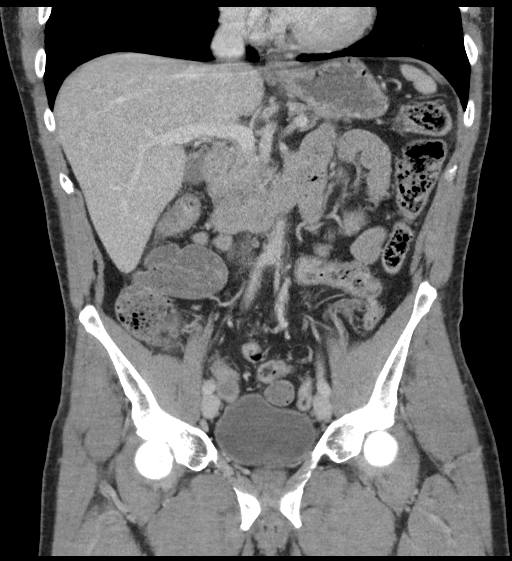
[im 50/90  soft-tissue]
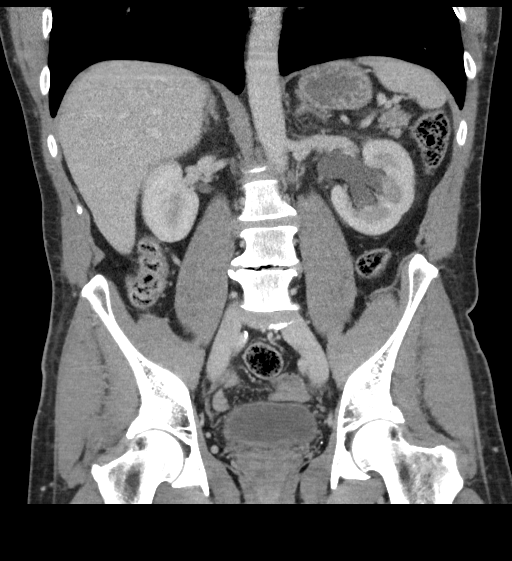

[16 of 46 positions shown; findings below may reference images not displayed]

FINDINGS: Lower chest: Minimal bibasilar atelectatic changes. The visualized
lung bases are otherwise clear.

No intra-abdominal free air or free fluid.

Hepatobiliary: Probable mild fatty infiltration of the liver. The
liver is otherwise unremarkable. No intrahepatic biliary ductal
dilatation. The gallbladder is unremarkable.

Pancreas: Unremarkable. No pancreatic ductal dilatation or
surrounding inflammatory changes.

Spleen: Normal in size without focal abnormality.

Adrenals/Urinary Tract: The adrenal glands are unremarkable. There
is a stone in the proximal left ureter/UPJ measuring up to 10 mm in
greatest dimension. There is associated mild left hydronephrosis.
There is a 4 mm nonobstructing stone in the inferior pole of the
left kidney. There is mild haziness and enhancement of the proximal
left ureteral urothelium. Correlation with urinalysis recommended to
exclude superimposed UTI. There is no hydronephrosis or
nephrolithiasis on the right. Subcentimeter right renal hypodense
lesion is too small to characterize. The urinary bladder is
unremarkable.

Stomach/Bowel: There is no bowel obstruction or active inflammation.
The appendix is not visualized with certainty. No inflammatory
changes identified in the right lower quadrant.

Vascular/Lymphatic: No significant vascular findings are present. No
enlarged abdominal or pelvic lymph nodes.

Reproductive: The prostate and seminal vesicles are grossly
unremarkable.

Other: Small fat containing umbilical hernia

Musculoskeletal: Mild degenerative changes of the spine primarily at
L4-L5. No acute osseous pathology.
IMPRESSION: 1. Obstructing 10 mm stone in the proximal left ureter/UPJ with mild
left hydronephrosis. Correlation with urinalysis recommended to
exclude superimposed UTI. An additional 4 mm non-obstructing stone
in the inferior pole of the left kidney is also noted.
2. No bowel obstruction or active inflammation.

## 2023-12-19 ENCOUNTER — Encounter (HOSPITAL_COMMUNITY): Payer: Self-pay | Admitting: Emergency Medicine

## 2023-12-19 ENCOUNTER — Emergency Department (HOSPITAL_COMMUNITY): Payer: Self-pay

## 2023-12-19 ENCOUNTER — Other Ambulatory Visit: Payer: Self-pay

## 2023-12-19 ENCOUNTER — Emergency Department (HOSPITAL_COMMUNITY)
Admission: EM | Admit: 2023-12-19 | Discharge: 2023-12-19 | Disposition: A | Discharge status: Home / Self Care | Payer: Self-pay | Attending: Emergency Medicine | Admitting: Emergency Medicine

## 2023-12-19 DIAGNOSIS — R0781 Pleurodynia: Secondary | ICD-10-CM | POA: Insufficient documentation

## 2023-12-19 DIAGNOSIS — R0789 Other chest pain: Secondary | ICD-10-CM

## 2023-12-19 LAB — CBC
HCT: 46.9 % (ref 39.0–52.0)
Hemoglobin: 15.5 g/dL (ref 13.0–17.0)
MCH: 30 pg (ref 26.0–34.0)
MCHC: 33 g/dL (ref 30.0–36.0)
MCV: 90.7 fL (ref 80.0–100.0)
Platelets: 279 K/uL (ref 150–400)
RBC: 5.17 MIL/uL (ref 4.22–5.81)
RDW: 12.8 % (ref 11.5–15.5)
WBC: 8.5 K/uL (ref 4.0–10.5)
nRBC: 0 % (ref 0.0–0.2)

## 2023-12-19 LAB — BASIC METABOLIC PANEL WITH GFR
Anion gap: 11 (ref 5–15)
BUN: 19 mg/dL (ref 8–23)
CO2: 26 mmol/L (ref 22–32)
Calcium: 9.3 mg/dL (ref 8.9–10.3)
Chloride: 103 mmol/L (ref 98–111)
Creatinine, Ser: 1.16 mg/dL (ref 0.61–1.24)
GFR, Estimated: >60 mL/min (ref >60)
Glucose, Bld: 109 mg/dL — ABNORMAL HIGH (ref 70–99)
Potassium: 4 mmol/L (ref 3.5–5.1)
Sodium: 140 mmol/L (ref 135–145)

## 2023-12-19 LAB — TROPONIN T, HIGH SENSITIVITY
Troponin T High Sensitivity: <15 ng/L (ref 0–19)
Troponin T High Sensitivity: <15 ng/L (ref 0–19)

## 2023-12-19 LAB — D-DIMER, QUANTITATIVE: D-Dimer, Quant: 1.04 ug{FEU}/mL — ABNORMAL HIGH (ref 0.00–0.50)

## 2023-12-19 MED ORDER — HYDROMORPHONE HCL 1 MG/ML IJ SOLN
0.5000 mg | Freq: Once | INTRAMUSCULAR | Status: AC
  Administered 2023-12-19: 0.5 mg via INTRAVENOUS
  Filled 2023-12-19: qty 0.5

## 2023-12-19 MED ORDER — IOHEXOL 350 MG/ML SOLN
75.0000 mL | Freq: Once | INTRAVENOUS | Status: AC | PRN
  Administered 2023-12-19: 75 mL via INTRAVENOUS

## 2023-12-19 NOTE — Discharge Instructions (Signed)
 Your test results today are reassuring.  Take ibuprofen  and Tylenol  as needed for pain.  Return to the emergency department for any new or worsening symptoms of concern.

## 2023-12-19 NOTE — ED Notes (Signed)
 Pt's oxygen increased to 95% on 2L [place]. Pt reports pain when inhaling deep on right side of chest.

## 2023-12-19 NOTE — ED Notes (Signed)
 Pt placed on 2L [place] due to pt's oxygen saturation not increasing above 93% on room air.

## 2023-12-19 NOTE — ED Provider Notes (Signed)
 Care of patient assumed from Dr. Lorette.  This patient presents with left-sided pleuritic chest pain.  Currently awaiting CTA of chest.  If negative, treat for MSK etiology. Physical Exam  BP 108/83   Pulse 62   Temp 98.5 F (36.9 C) (Oral)   Resp 17   Ht 5' 10 (1.778 m)   Wt 86.2 kg   SpO2 97%   BMI 27.26 kg/m   Physical Exam Vitals and nursing note reviewed.  Constitutional:      General: He is not in acute distress.    Appearance: He is well-developed. He is not ill-appearing, toxic-appearing or diaphoretic.  HENT:     Head: Normocephalic and atraumatic.  Eyes:     Conjunctiva/sclera: Conjunctivae normal.  Cardiovascular:     Rate and Rhythm: Normal rate and regular rhythm.  Pulmonary:     Effort: Pulmonary effort is normal.  Musculoskeletal:        General: No swelling.  Skin:    General: Skin is warm and dry.  Neurological:     General: No focal deficit present.     Mental Status: He is alert and oriented to person, place, and time.  Psychiatric:        Mood and Affect: Mood normal.        Behavior: Behavior normal.     Procedures  Procedures  ED Course / MDM   Clinical Course as of 12/19/23 0816  Tue Dec 19, 2023  0427 Initial Evaluation:  *   Patient is experiencing sharp left-sided chest pain possibly due to recent exercise.   Plan:  *   Obtain troponins and D dimer to evaluate for myocardial injury and rule out pulmonary embolism. *   If tests are negative, consider musculoskeletal cause and recommend outpatient follow-up. [JM]  [phone] The differential diagnosis for this presentation includes acute coronary syndrome (ACS), pulmonary embolism, pneumothorax, musculoskeletal pain, and pneumonia. Less likely considerations include pericarditis and aortic dissection. [JM]  0515 D-Dimer, Quant(!): 1.04 Will order cta [JM]  0515 Troponin T High Sensitivity: <15 [JM]  0515 DG Chest 2 View No obvious PTX, hamptons hump, effusion on my interpretation [JM]     Clinical Course User Index [JM] Mesner, Selinda, MD   Medical Decision Making Amount and/or Complexity of Data Reviewed Labs: ordered. Decision-making details documented in ED Course. Radiology: ordered. Decision-making details documented in ED Course.  Risk Prescription drug management.   On assessment, patient ambulatory with improved pain.  SpO2 is normal on room air.  His CTA did not show any evidence of PE.  He declined any prescription pain medication.  Patient to treat pain with ibuprofen.  He was discharged in good condition.       Melvenia Motto, MD 12/19/23 [phone]

## 2023-12-19 NOTE — ED Triage Notes (Signed)
 Pt c/o of left sided chest pain that started yesterday after working out. Pain has been constant.

## 2023-12-19 NOTE — ED Provider Notes (Signed)
 Emergency Department Provider Note  TRIAGE NOTE: Pt c/o of left sided chest pain that started yesterday after working out. Pain has been constant.   HISTORY  Chief Complaint Chest Pain   HPI Fernando [name] is a 63 y.o. male with   left-sided chest pain that began yesterday. The pain is described as sharp and localized, without radiation, and does not wrap around the chest. The patient reports that the pain woke him up this morning and was initially felt after a workout session yesterday, leading him to suspect a possible muscle pull. The pain is exacerbated by deep breathing. He denies any fever, cough, recent illness, shortness of breath, lightheadedness, or diaphoresis. The patient has a history of deep muscle pulls in the past, though typically in a different location. He denies any significant medical history, medication use, or substance abuse, and takes a multivitamin, creatine, and vitamin D as supplements. There is no history of tobacco, alcohol, or drug use. The patient denies any leg swelling or pain. History was obtained from the patient and his wife.  PMH Past Medical History:  Diagnosis Date   History of kidney stones    Hyperlipidemia     Home Medications Prior to Admission medications   Not on File    Social History Social History   Tobacco Use   Smoking status: Never   Smokeless tobacco: Never  Vaping Use   Vaping status: Never Used  Substance Use Topics   Alcohol use: Never   Drug use: Never    Review of Systems: Documented in HPI ____________________________________________  PHYSICAL EXAM: VITAL SIGNS: Triage: Blood pressure (!) 150/76, pulse 74, temperature 98.5 F (36.9 C), temperature source Oral, resp. rate 16, height 5' 10 (1.778 m), weight 86.2 kg, SpO2 96%.  Vitals:   12/19/23 0340 12/19/23 0345 12/19/23 0416 12/19/23 0420  BP: (!) 131/95 (!) 150/76    Pulse: 72 78 72 74  Resp: 18 15 17 16   Temp:      TempSrc:      SpO2: 94% 93%  95% 96%  Weight:      Height:        Physical Exam Vitals and nursing note reviewed.  Constitutional:      Appearance: He is well-developed.  HENT:     Head: Normocephalic and atraumatic.  Cardiovascular:     Rate and Rhythm: Normal rate.  Pulmonary:     Effort: Pulmonary effort is normal. No respiratory distress.  Chest:     Chest wall: Tenderness (left lower chest wall reproducible w/ palpation) present.  Abdominal:     General: There is no distension.  Musculoskeletal:        General: Normal range of motion.     Cervical back: Normal range of motion.  Neurological:     Mental Status: He is alert.       ____________________________________________   LABS (all labs ordered are listed, but only abnormal results are displayed)  Labs Reviewed  D-DIMER, QUANTITATIVE - Abnormal; Notable for the following components:      Result Value   D-Dimer, Quant 1.04 (*)    All other components within normal limits  CBC  BASIC METABOLIC PANEL WITH GFR  TROPONIN T, HIGH SENSITIVITY   ____________________________________________  EKG   EKG Interpretation Date/Time:  Tuesday December 19 2023 03:38:16 EST Ventricular Rate:  69 PR Interval:  173 QRS Duration:  111 QT Interval:  411 QTC Calculation: 441 R Axis:   83  Text Interpretation: Sinus  rhythm Borderline right axis deviation axis changed since 2008 Confirmed by Brionna Romanek, Selinda [phone]) on 12/19/2023 4:18:47 AM        ____________________________________________  RADIOLOGY  No results found. ____________________________________________  PROCEDURES  Procedure(s) performed:   Procedures ____________________________________________  INITIAL IMPRESSION / ASSESSMENT AND PLAN   Clinical Course as of 12/19/23 2342  Tue Dec 19, 2023  0427 Initial Evaluation:  *   Patient is experiencing sharp left-sided chest pain possibly due to recent exercise.   Plan:  *   Obtain troponins and D dimer to evaluate for  myocardial injury and rule out pulmonary embolism. *   If tests are negative, consider musculoskeletal cause and recommend outpatient follow-up. [JM]  [phone] The differential diagnosis for this presentation includes acute coronary syndrome (ACS), pulmonary embolism, pneumothorax, musculoskeletal pain, and pneumonia. Less likely considerations include pericarditis and aortic dissection. [JM]  0515 D-Dimer, Quant(!): 1.04 Will order cta [JM]  0515 Troponin T High Sensitivity: <15 [JM]  0515 DG Chest 2 View No obvious PTX, hamptons hump, effusion on my interpretation [JM]    Clinical Course User Index [JM] Jarry Manon, Selinda, MD     Images ordered viewed and obtained by myself. Agree with Radiology interpretation. Details in ED course.  Labs ordered reviewed by myself as detailed in ED course.  Consultations obtained/considered detailed in ED course.    CRITICAL INTERVENTIONS:  N/a  Symptoms improving quite a bit. Not as bad if he over extends his back and takes a deep breath. Still thinking likely MSK. CTA without e/o large PE on my interpretation, pending rads read.   Care transferred pending completion of workup, reevaluation and disposition.    Lorette Selinda, MD 12/19/23 [phone]
